# Patient Record
Sex: Female | Born: 1995 | Race: White | Hispanic: No | Marital: Single | Smoking: Never smoker
Health system: Southern US, Community
[De-identification: ages and names within clinical notes are randomized; demographics above are authoritative.]

## PROBLEM LIST (undated history)

## (undated) VITALS — BP 152/104 | HR 115 | Temp 98.0°F | Resp 18 | Ht 65.0 in | Wt 199.0 lb

## (undated) DIAGNOSIS — I10 Essential (primary) hypertension: Secondary | ICD-10-CM

## (undated) DIAGNOSIS — F32A Depression, unspecified: Secondary | ICD-10-CM

## (undated) DIAGNOSIS — F4310 Post-traumatic stress disorder, unspecified: Secondary | ICD-10-CM

## (undated) DIAGNOSIS — K824 Cholesterolosis of gallbladder: Secondary | ICD-10-CM

## (undated) DIAGNOSIS — IMO0002 Reserved for concepts with insufficient information to code with codable children: Secondary | ICD-10-CM

## (undated) DIAGNOSIS — K449 Diaphragmatic hernia without obstruction or gangrene: Secondary | ICD-10-CM

## (undated) DIAGNOSIS — F419 Anxiety disorder, unspecified: Secondary | ICD-10-CM

## (undated) DIAGNOSIS — F1290 Cannabis use, unspecified, uncomplicated: Secondary | ICD-10-CM

## (undated) DIAGNOSIS — R112 Nausea with vomiting, unspecified: Secondary | ICD-10-CM

## (undated) DIAGNOSIS — F12988 Cannabis use, unspecified with other cannabis-induced disorder: Secondary | ICD-10-CM

## (undated) DIAGNOSIS — F1310 Sedative, hypnotic or anxiolytic abuse, uncomplicated: Secondary | ICD-10-CM

## (undated) DIAGNOSIS — F329 Major depressive disorder, single episode, unspecified: Secondary | ICD-10-CM

---

## 2015-04-25 ENCOUNTER — Encounter (HOSPITAL_COMMUNITY): Payer: Self-pay | Admitting: Emergency Medicine

## 2015-04-25 ENCOUNTER — Emergency Department (HOSPITAL_COMMUNITY)
Admission: EM | Admit: 2015-04-25 | Discharge: 2015-04-26 | Disposition: A | Discharge status: Home / Self Care | Payer: BLUE CROSS/BLUE SHIELD | Attending: Emergency Medicine | Admitting: Emergency Medicine

## 2015-04-25 ENCOUNTER — Emergency Department (HOSPITAL_COMMUNITY): Payer: BLUE CROSS/BLUE SHIELD

## 2015-04-25 DIAGNOSIS — R109 Unspecified abdominal pain: Secondary | ICD-10-CM | POA: Diagnosis present

## 2015-04-25 DIAGNOSIS — R112 Nausea with vomiting, unspecified: Secondary | ICD-10-CM | POA: Diagnosis not present

## 2015-04-25 DIAGNOSIS — Z3202 Encounter for pregnancy test, result negative: Secondary | ICD-10-CM | POA: Diagnosis not present

## 2015-04-25 LAB — CBC
HCT: 41 % (ref 36.0–46.0)
HEMOGLOBIN: 14.2 g/dL (ref 12.0–15.0)
MCH: 29.3 pg (ref 26.0–34.0)
MCHC: 34.6 g/dL (ref 30.0–36.0)
MCV: 84.7 fL (ref 78.0–100.0)
Platelets: 344 10*3/uL (ref 150–400)
RBC: 4.84 MIL/uL (ref 3.87–5.11)
RDW: 12.8 % (ref 11.5–15.5)
WBC: 19 10*3/uL — ABNORMAL HIGH (ref 4.0–10.5)

## 2015-04-25 LAB — COMPREHENSIVE METABOLIC PANEL
ALBUMIN: 5.1 g/dL — AB (ref 3.5–5.0)
ALT: 16 U/L (ref 14–54)
ANION GAP: 12 (ref 5–15)
AST: 19 U/L (ref 15–41)
Alkaline Phosphatase: 72 U/L (ref 38–126)
BILIRUBIN TOTAL: 0.5 mg/dL (ref 0.3–1.2)
BUN: 9 mg/dL (ref 6–20)
CO2: 23 mmol/L (ref 22–32)
CREATININE: 0.62 mg/dL (ref 0.44–1.00)
Calcium: 9.7 mg/dL (ref 8.9–10.3)
Chloride: 101 mmol/L (ref 101–111)
GFR calc Af Amer: >60 mL/min (ref >60)
GFR calc non Af Amer: >60 mL/min (ref >60)
Glucose, Bld: 131 mg/dL — ABNORMAL HIGH (ref 65–99)
Potassium: 3.7 mmol/L (ref 3.5–5.1)
SODIUM: 136 mmol/L (ref 135–145)
TOTAL PROTEIN: 8.5 g/dL — AB (ref 6.5–8.1)

## 2015-04-25 LAB — CBC WITH DIFFERENTIAL/PLATELET
Basophils Absolute: 0 10*3/uL (ref 0.0–0.1)
Basophils Relative: 0 % (ref 0–1)
Eosinophils Absolute: 0.1 10*3/uL (ref 0.0–0.7)
Eosinophils Relative: 1 % (ref 0–5)
HCT: 39.8 % (ref 36.0–46.0)
HEMOGLOBIN: 12.5 g/dL (ref 12.0–15.0)
LYMPHS ABS: 1.8 10*3/uL (ref 0.7–4.0)
Lymphocytes Relative: 23 % (ref 12–46)
MCH: 25.7 pg — ABNORMAL LOW (ref 26.0–34.0)
MCHC: 31.4 g/dL (ref 30.0–36.0)
MCV: 81.7 fL (ref 78.0–100.0)
MONOS PCT: 6 % (ref 3–12)
Monocytes Absolute: 0.5 10*3/uL (ref 0.1–1.0)
NEUTROS ABS: 5.6 10*3/uL (ref 1.7–7.7)
NEUTROS PCT: 70 % (ref 43–77)
PLATELETS: 227 10*3/uL (ref 150–400)
RBC: 4.87 MIL/uL (ref 3.87–5.11)
RDW: 15.6 % — ABNORMAL HIGH (ref 11.5–15.5)
WBC: 8 10*3/uL (ref 4.0–10.5)

## 2015-04-25 LAB — DIFFERENTIAL
BASOS PCT: 0 % (ref 0–1)
Basophils Absolute: 0 10*3/uL (ref 0.0–0.1)
EOS ABS: 0 10*3/uL (ref 0.0–0.7)
Eosinophils Relative: 0 % (ref 0–5)
Lymphocytes Relative: 6 % — ABNORMAL LOW (ref 12–46)
Lymphs Abs: 1.1 10*3/uL (ref 0.7–4.0)
MONOS PCT: 2 % — AB (ref 3–12)
Monocytes Absolute: 0.3 10*3/uL (ref 0.1–1.0)
Neutro Abs: 17.6 10*3/uL — ABNORMAL HIGH (ref 1.7–7.7)
Neutrophils Relative %: 92 % — ABNORMAL HIGH (ref 43–77)

## 2015-04-25 LAB — POC URINE PREG, ED: Preg Test, Ur: NEGATIVE

## 2015-04-25 MED ORDER — IOHEXOL 300 MG/ML  SOLN
100.0000 mL | Freq: Once | INTRAMUSCULAR | Status: AC | PRN
  Administered 2015-04-25: 100 mL via INTRAVENOUS

## 2015-04-25 MED ORDER — IOHEXOL 300 MG/ML  SOLN
25.0000 mL | Freq: Once | INTRAMUSCULAR | Status: AC | PRN
  Administered 2015-04-25: 50 mL via ORAL

## 2015-04-25 MED ORDER — HYDROMORPHONE HCL 1 MG/ML IJ SOLN
0.5000 mg | Freq: Once | INTRAMUSCULAR | Status: AC
  Administered 2015-04-25: 0.5 mg via INTRAVENOUS
  Filled 2015-04-25: qty 1

## 2015-04-25 MED ORDER — ONDANSETRON HCL 4 MG/2ML IJ SOLN
4.0000 mg | Freq: Once | INTRAMUSCULAR | Status: AC
  Administered 2015-04-25: 4 mg via INTRAVENOUS
  Filled 2015-04-25: qty 2

## 2015-04-25 MED ORDER — SODIUM CHLORIDE 0.9 % IV BOLUS (SEPSIS)
1000.0000 mL | Freq: Once | INTRAVENOUS | Status: AC
Start: 2015-04-25 — End: 2015-04-26
  Administered 2015-04-25: 1000 mL via INTRAVENOUS

## 2015-04-25 NOTE — ED Notes (Signed)
Reports received from previous RN, [employer]

## 2015-04-25 NOTE — ED Notes (Signed)
After lab draw blood walked down to main lab

## 2015-04-25 NOTE — ED Provider Notes (Signed)
CSN: [id number]     Arrival date & time 04/25/15  1740 History   First MD Initiated Contact with Patient 04/25/15 2146     Chief Complaint  Patient presents with  . Abdominal Pain     (Consider location/radiation/quality/duration/timing/severity/associated sxs/prior Treatment) Patient is a 19 y.o. female presenting with vomiting. The history is provided by the patient. No language interpreter was used.  Emesis Severity:  Moderate Duration:  1 day Timing:  Constant Number of daily episodes:  Multiple Quality:  Unable to specify Progression:  Unchanged Chronicity:  New Recent urination:  Normal Relieved by:  Nothing Worsened by:  Nothing tried Ineffective treatments:  None tried Associated symptoms: abdominal pain   Risk factors: sick contacts   Pt complains of nausea and vomiting.  Pt reports cramping in abdomen.  Pt has soreness right side of abdomen and cramping on right side.  History reviewed. No pertinent past medical history. History reviewed. No pertinent past surgical history. No family history on file. History  Substance Use Topics  . Smoking status: Never Smoker   . Smokeless tobacco: Not on file  . Alcohol Use: No   OB History    No data available     Review of Systems  Gastrointestinal: Positive for vomiting and abdominal pain.  All other systems reviewed and are negative.     Allergies  Review of patient's allergies indicates not on file.  Home Medications   Prior to Admission medications   Not on File   BP 152/79 mmHg  Pulse 85  Temp(Src) 98.3 F (36.8 C) (Oral)  Resp 16  SpO2 100%  LMP 04/11/2015 Physical Exam  Constitutional: She is oriented to person, place, and time. She appears well-developed and well-nourished.  HENT:  Head: Normocephalic and atraumatic.  Right Ear: External ear normal.  Left Ear: External ear normal.  Eyes: Conjunctivae and EOM are normal. Pupils are equal, round, and reactive to light.  Neck: Normal range of  motion.  Cardiovascular: Normal rate and normal heart sounds.   Pulmonary/Chest: Effort normal.  Abdominal: Soft. She exhibits no distension.  Musculoskeletal: Normal range of motion.  Neurological: She is alert and oriented to person, place, and time.  Skin: Skin is warm.  Psychiatric: She has a normal mood and affect.  Nursing note and vitals reviewed.   ED Course  Procedures (including critical care time) Labs Review Labs Reviewed  CBC WITH DIFFERENTIAL/PLATELET - Abnormal; Notable for the following:    MCH 25.7 (*)    RDW 15.6 (*)    All other components within normal limits  CBC - Abnormal; Notable for the following:    WBC 19.0 (*)    All other components within normal limits  COMPREHENSIVE METABOLIC PANEL - Abnormal; Notable for the following:    Glucose, Bld 131 (*)    Total Protein 8.5 (*)    Albumin 5.1 (*)    All other components within normal limits  DIFFERENTIAL - Abnormal; Notable for the following:    Neutrophils Relative % 92 (*)    Neutro Abs 17.6 (*)    Lymphocytes Relative 6 (*)    Monocytes Relative 2 (*)    All other components within normal limits  URINALYSIS, ROUTINE W REFLEX MICROSCOPIC (NOT AT [facility])  POC URINE PREG, ED   Results for orders placed or performed during the hospital encounter of 04/25/15  CBC with Differential  Result Value Ref Range   WBC 8.0 4.0 - 10.5 K/uL   RBC 4.87 3.87 -  5.11 MIL/uL   Hemoglobin 12.5 12.0 - 15.0 g/dL   HCT 78.2 95.6 - 21.3 %   MCV 81.7 78.0 - 100.0 fL   MCH 25.7 (L) 26.0 - 34.0 pg   MCHC 31.4 30.0 - 36.0 g/dL   RDW 08.6 (H) 57.8 - 46.9 %   Platelets 227 150 - 400 K/uL   Neutrophils Relative % 70 43 - 77 %   Neutro Abs 5.6 1.7 - 7.7 K/uL   Lymphocytes Relative 23 12 - 46 %   Lymphs Abs 1.8 0.7 - 4.0 K/uL   Monocytes Relative 6 3 - 12 %   Monocytes Absolute 0.5 0.1 - 1.0 K/uL   Eosinophils Relative 1 0 - 5 %   Eosinophils Absolute 0.1 0.0 - 0.7 K/uL   Basophils Relative 0 0 - 1 %   Basophils Absolute  0.0 0.0 - 0.1 K/uL  Urinalysis, Routine w reflex microscopic (not at [facility])  Result Value Ref Range   Color, Urine YELLOW YELLOW   APPearance CLEAR CLEAR   Specific Gravity, Urine 1.023 1.005 - 1.030   pH 7.5 5.0 - 8.0   Glucose, UA NEGATIVE NEGATIVE mg/dL   Hgb urine dipstick TRACE (A) NEGATIVE   Bilirubin Urine NEGATIVE NEGATIVE   Ketones, ur >80 (A) NEGATIVE mg/dL   Protein, ur NEGATIVE NEGATIVE mg/dL   Urobilinogen, UA 0.2 0.0 - 1.0 mg/dL   Nitrite NEGATIVE NEGATIVE   Leukocytes, UA NEGATIVE NEGATIVE  CBC  Result Value Ref Range   WBC 19.0 (H) 4.0 - 10.5 K/uL   RBC 4.84 3.87 - 5.11 MIL/uL   Hemoglobin 14.2 12.0 - 15.0 g/dL   HCT 62.9 52.8 - 41.3 %   MCV 84.7 78.0 - 100.0 fL   MCH 29.3 26.0 - 34.0 pg   MCHC 34.6 30.0 - 36.0 g/dL   RDW 24.4 01.0 - 27.2 %   Platelets 344 150 - 400 K/uL  Comprehensive metabolic panel  Result Value Ref Range   Sodium 136 135 - 145 mmol/L   Potassium 3.7 3.5 - 5.1 mmol/L   Chloride 101 101 - 111 mmol/L   CO2 23 22 - 32 mmol/L   Glucose, Bld 131 (H) 65 - 99 mg/dL   BUN 9 6 - 20 mg/dL   Creatinine, Ser 5.36 0.44 - 1.00 mg/dL   Calcium 9.7 8.9 - 64.4 mg/dL   Total Protein 8.5 (H) 6.5 - 8.1 g/dL   Albumin 5.1 (H) 3.5 - 5.0 g/dL   AST 19 15 - 41 U/L   ALT 16 14 - 54 U/L   Alkaline Phosphatase 72 38 - 126 U/L   Total Bilirubin 0.5 0.3 - 1.2 mg/dL   GFR calc non Af Amer >60 >60 mL/min   GFR calc Af Amer >60 >60 mL/min   Anion gap 12 5 - 15  Differential  Result Value Ref Range   Neutrophils Relative % 92 (H) 43 - 77 %   Neutro Abs 17.6 (H) 1.7 - 7.7 K/uL   Lymphocytes Relative 6 (L) 12 - 46 %   Lymphs Abs 1.1 0.7 - 4.0 K/uL   Monocytes Relative 2 (L) 3 - 12 %   Monocytes Absolute 0.3 0.1 - 1.0 K/uL   Eosinophils Relative 0 0 - 5 %   Eosinophils Absolute 0.0 0.0 - 0.7 K/uL   Basophils Relative 0 0 - 1 %   Basophils Absolute 0.0 0.0 - 0.1 K/uL  Urine microscopic-add on  Result Value Ref Range   Squamous Epithelial /  LPF RARE RARE    WBC, UA 0-2 <3 WBC/hpf   RBC / HPF 0-2 <3 RBC/hpf   Bacteria, UA RARE RARE  POC Urine Pregnancy, ED  (If Pre-menopausal female)  not at [facility]  Result Value Ref Range   Preg Test, Ur NEGATIVE NEGATIVE   Ct Abdomen Pelvis W Contrast  04/26/2015   CLINICAL DATA:  Pain with vomiting beginning this morning. Leukocytosis.  EXAM: CT ABDOMEN AND PELVIS WITH CONTRAST  TECHNIQUE: Multidetector CT imaging of the abdomen and pelvis was performed using the standard protocol following bolus administration of intravenous contrast.  CONTRAST:  50mL OMNIPAQUE IOHEXOL 300 MG/ML SOLN, OMNIPAQUE IOHEXOL 300 MG/ML SOLN  COMPARISON:  None.  FINDINGS: LUNG BASES: Included view of the lung bases are clear. Visualized heart and pericardium are unremarkable.  SOLID ORGANS: The liver, spleen, gallbladder, pancreas and adrenal glands are unremarkable.  GASTROINTESTINAL TRACT: The stomach, small and large bowel are normal in course and caliber without inflammatory changes. Identifiable portions of the appendix appear normal.  KIDNEYS/ URINARY TRACT: Kidneys are orthotopic, demonstrating symmetric enhancement. No nephrolithiasis, hydronephrosis or solid renal masses. The unopacified ureters are normal in course and caliber. Urinary bladder is partially distended and unremarkable.  PERITONEUM/RETROPERITONEUM: Aortoiliac vessels are normal in course and caliber. No lymphadenopathy by CT size criteria. Thickened edematous appearing cervix. Small area presumed blood products within the lower uterine segment. Small amount of free fluid in the pelvis without abscess nor intraperitoneal free air.  SOFT TISSUE/OSSEOUS STRUCTURES: Non-suspicious. Subcentimeter probable bone island RIGHT sacrum.  IMPRESSION: Findings concerning for cervicitis.  No CT evidence of acute appendicitis.   Electronically Signed   By: Awilda Metro M.D.   On: 04/26/2015 00:28    Imaging Review No results found.   EKG Interpretation None      MDM   Pt  given iv fluids, zofran and dilaudid.  Ct scan no appendicitis.   Final diagnoses:  Abdominal pain  Non-intractable vomiting with nausea, vomiting of unspecified type    zofran odt Follow up with primary care.  Return if symptoms persist more than 24 hours.    Elson Areas, PA-C 04/26/15 0114  Elson Areas, PA-C 04/26/15 0140  Donnetta Hutching, MD 04/26/15 [phone]

## 2015-04-25 NOTE — ED Notes (Addendum)
Notified main lab that blood was mislabeled.  Also notified charge nurse. Noitified EDP Adriana Simas and [place]

## 2015-04-25 NOTE — ED Notes (Signed)
Per pt, states abdominal pain since this am-vomiting

## 2015-04-25 NOTE — ED Notes (Signed)
Informed the pt that a urine sample is needed.

## 2015-04-26 LAB — URINALYSIS, ROUTINE W REFLEX MICROSCOPIC
Bilirubin Urine: NEGATIVE
Glucose, UA: NEGATIVE mg/dL
Ketones, ur: >80 mg/dL — AB
Leukocytes, UA: NEGATIVE
Nitrite: NEGATIVE
Protein, ur: NEGATIVE mg/dL
Specific Gravity, Urine: 1.023 (ref 1.005–1.030)
Urobilinogen, UA: 0.2 mg/dL (ref 0.0–1.0)
pH: 7.5 (ref 5.0–8.0)

## 2015-04-26 LAB — URINE MICROSCOPIC-ADD ON

## 2015-04-26 MED ORDER — ONDANSETRON 4 MG PO TBDP: 4.0000 mg | ORAL_TABLET | Freq: Three times a day (TID) | ORAL | Status: DC | PRN

## 2015-04-26 MED ORDER — SODIUM CHLORIDE 0.9 % IV SOLN
Freq: Once | INTRAVENOUS | Status: AC
  Administered 2015-04-26: 01:00:00 via INTRAVENOUS

## 2015-04-26 NOTE — Discharge Instructions (Signed)
Abdominal Pain °Many things can cause abdominal pain. Usually, abdominal pain is not caused by a disease and will improve without treatment. It can often be observed and treated at home. Your health care provider will do a physical exam and possibly order blood tests and X-rays to help determine the seriousness of your pain. However, in many cases, more time must pass before a clear cause of the pain can be found. Before that point, your health care provider may not know if you need more testing or further treatment. °HOME CARE INSTRUCTIONS  °Monitor your abdominal pain for any changes. The following actions may help to alleviate any discomfort you are experiencing: °· Only take over-the-counter or prescription medicines as directed by your health care provider. °· Do not take laxatives unless directed to do so by your health care provider. °· Try a clear liquid diet (broth, tea, or water) as directed by your health care provider. Slowly move to a bland diet as tolerated. °SEEK MEDICAL CARE IF: °· You have unexplained abdominal pain. °· You have abdominal pain associated with nausea or diarrhea. °· You have pain when you urinate or have a bowel movement. °· You experience abdominal pain that wakes you in the night. °· You have abdominal pain that is worsened or improved by eating food. °· You have abdominal pain that is worsened with eating fatty foods. °· You have a fever. °SEEK IMMEDIATE MEDICAL CARE IF:  °· Your pain does not go away within 2 hours. °· You keep throwing up (vomiting). °· Your pain is felt only in portions of the abdomen, such as the right side or the left lower portion of the abdomen. °· You pass bloody or black tarry stools. °MAKE SURE YOU: °· Understand these instructions.   °· Will watch your condition.   °· Will get help right away if you are not doing well or get worse.   °Document Released: 07/28/2005 Document Revised: 10/23/2013 Document Reviewed: 06/27/2013 °[employer]® Patient Information  ©2015 [employer]. This information is not intended to replace advice given to you by your health care provider. Make sure you discuss any questions you have with your health care provider. ° °Nausea and Vomiting °Nausea is a sick feeling that often comes before throwing up (vomiting). Vomiting is a reflex where stomach contents come out of your mouth. Vomiting can cause severe loss of body fluids (dehydration). Children and elderly adults can become dehydrated quickly, especially if they also have diarrhea. Nausea and vomiting are symptoms of a condition or disease. It is important to find the cause of your symptoms. °CAUSES  °· Direct irritation of the stomach lining. This irritation can result from increased acid production (gastroesophageal reflux disease), infection, food poisoning, taking certain medicines (such as nonsteroidal anti-inflammatory drugs), alcohol use, or tobacco use. °· Signals from the brain. These signals could be caused by a headache, heat exposure, an inner ear disturbance, increased pressure in the brain from injury, infection, a tumor, or a concussion, pain, emotional stimulus, or metabolic problems. °· An obstruction in the gastrointestinal tract (bowel obstruction). °· Illnesses such as diabetes, hepatitis, gallbladder problems, appendicitis, kidney problems, cancer, sepsis, atypical symptoms of a heart attack, or eating disorders. °· Medical treatments such as chemotherapy and radiation. °· Receiving medicine that makes you sleep (general anesthetic) during surgery. °DIAGNOSIS °Your caregiver may ask for tests to be done if the problems do not improve after a few days. Tests may also be done if symptoms are severe or if the reason for the nausea   and vomiting is not clear. Tests may include: °· Urine tests. °· Blood tests. °· Stool tests. °· Cultures (to look for evidence of infection). °· X-rays or other imaging studies. °Test results can help your caregiver make decisions about  treatment or the need for additional tests. °TREATMENT °You need to stay well hydrated. Drink frequently but in small amounts. You may wish to drink water, sports drinks, clear broth, or eat frozen ice pops or gelatin dessert to help stay hydrated. When you eat, eating slowly may help prevent nausea. There are also some antinausea medicines that may help prevent nausea. °HOME CARE INSTRUCTIONS  °· Take all medicine as directed by your caregiver. °· If you do not have an appetite, do not force yourself to eat. However, you must continue to drink fluids. °· If you have an appetite, eat a normal diet unless your caregiver tells you differently. °¨ Eat a variety of complex carbohydrates (rice, wheat, potatoes, bread), lean meats, yogurt, fruits, and vegetables. °¨ Avoid high-fat foods because they are more difficult to digest. °· Drink enough water and fluids to keep your urine clear or pale yellow. °· If you are dehydrated, ask your caregiver for specific rehydration instructions. Signs of dehydration may include: °¨ Severe thirst. °¨ Dry lips and mouth. °¨ Dizziness. °¨ Dark urine. °¨ Decreasing urine frequency and amount. °¨ Confusion. °¨ Rapid breathing or pulse. °SEEK IMMEDIATE MEDICAL CARE IF:  °· You have blood or brown flecks (like coffee grounds) in your vomit. °· You have black or bloody stools. °· You have a severe headache or stiff neck. °· You are confused. °· You have severe abdominal pain. °· You have chest pain or trouble breathing. °· You do not urinate at least once every 8 hours. °· You develop cold or clammy skin. °· You continue to vomit for longer than 24 to 48 hours. °· You have a fever. °MAKE SURE YOU:  °· Understand these instructions. °· Will watch your condition. °· Will get help right away if you are not doing well or get worse. °Document Released: 10/18/2005 Document Revised: 01/10/2012 Document Reviewed: 03/17/2011 °[employer]® Patient Information ©2015 [employer]. This information is not  intended to replace advice given to you by your health care provider. Make sure you discuss any questions you have with your health care provider. ° °

## 2015-04-29 ENCOUNTER — Emergency Department (HOSPITAL_COMMUNITY)
Admission: EM | Admit: 2015-04-29 | Discharge: 2015-04-29 | Disposition: A | Discharge status: Home / Self Care | Payer: BLUE CROSS/BLUE SHIELD | Attending: Emergency Medicine | Admitting: Emergency Medicine

## 2015-04-29 ENCOUNTER — Encounter (HOSPITAL_COMMUNITY): Payer: Self-pay

## 2015-04-29 ENCOUNTER — Emergency Department (HOSPITAL_COMMUNITY): Payer: BLUE CROSS/BLUE SHIELD

## 2015-04-29 DIAGNOSIS — R064 Hyperventilation: Secondary | ICD-10-CM | POA: Insufficient documentation

## 2015-04-29 DIAGNOSIS — R1013 Epigastric pain: Secondary | ICD-10-CM | POA: Diagnosis present

## 2015-04-29 DIAGNOSIS — Z3202 Encounter for pregnancy test, result negative: Secondary | ICD-10-CM | POA: Diagnosis not present

## 2015-04-29 DIAGNOSIS — K2970 Gastritis, unspecified, without bleeding: Secondary | ICD-10-CM

## 2015-04-29 LAB — URINALYSIS, ROUTINE W REFLEX MICROSCOPIC
BILIRUBIN URINE: NEGATIVE
Glucose, UA: NEGATIVE mg/dL
Ketones, ur: >80 mg/dL — AB
Leukocytes, UA: NEGATIVE
Nitrite: NEGATIVE
PH: 8 (ref 5.0–8.0)
Protein, ur: NEGATIVE mg/dL
SPECIFIC GRAVITY, URINE: 1.02 (ref 1.005–1.030)
Urobilinogen, UA: 0.2 mg/dL (ref 0.0–1.0)

## 2015-04-29 LAB — CBC WITH DIFFERENTIAL/PLATELET
Basophils Absolute: 0 10*3/uL (ref 0.0–0.1)
Basophils Relative: 0 % (ref 0–1)
EOS ABS: 0 10*3/uL (ref 0.0–0.7)
EOS PCT: 0 % (ref 0–5)
HCT: 44.6 % (ref 36.0–46.0)
HEMOGLOBIN: 15.5 g/dL — AB (ref 12.0–15.0)
LYMPHS ABS: 1.1 10*3/uL (ref 0.7–4.0)
Lymphocytes Relative: 8 % — ABNORMAL LOW (ref 12–46)
MCH: 29.5 pg (ref 26.0–34.0)
MCHC: 34.8 g/dL (ref 30.0–36.0)
MCV: 84.8 fL (ref 78.0–100.0)
MONOS PCT: 3 % (ref 3–12)
Monocytes Absolute: 0.4 10*3/uL (ref 0.1–1.0)
Neutro Abs: 12.3 10*3/uL — ABNORMAL HIGH (ref 1.7–7.7)
Neutrophils Relative %: 89 % — ABNORMAL HIGH (ref 43–77)
Platelets: 411 10*3/uL — ABNORMAL HIGH (ref 150–400)
RBC: 5.26 MIL/uL — AB (ref 3.87–5.11)
RDW: 12.7 % (ref 11.5–15.5)
WBC: 13.8 10*3/uL — ABNORMAL HIGH (ref 4.0–10.5)

## 2015-04-29 LAB — COMPREHENSIVE METABOLIC PANEL
ALBUMIN: 5.2 g/dL — AB (ref 3.5–5.0)
ALK PHOS: 66 U/L (ref 38–126)
ALT: 18 U/L (ref 14–54)
AST: 19 U/L (ref 15–41)
Anion gap: 16 — ABNORMAL HIGH (ref 5–15)
BILIRUBIN TOTAL: 0.8 mg/dL (ref 0.3–1.2)
BUN: 8 mg/dL (ref 6–20)
CHLORIDE: 102 mmol/L (ref 101–111)
CO2: 22 mmol/L (ref 22–32)
CREATININE: 0.88 mg/dL (ref 0.44–1.00)
Calcium: 9.8 mg/dL (ref 8.9–10.3)
GFR calc Af Amer: >60 mL/min (ref >60)
GFR calc non Af Amer: >60 mL/min (ref >60)
Glucose, Bld: 135 mg/dL — ABNORMAL HIGH (ref 65–99)
Potassium: 3.3 mmol/L — ABNORMAL LOW (ref 3.5–5.1)
Sodium: 140 mmol/L (ref 135–145)
Total Protein: 8.3 g/dL — ABNORMAL HIGH (ref 6.5–8.1)

## 2015-04-29 LAB — WET PREP, GENITAL
TRICH WET PREP: NONE SEEN
Yeast Wet Prep HPF POC: NONE SEEN

## 2015-04-29 LAB — URINE MICROSCOPIC-ADD ON

## 2015-04-29 LAB — PREGNANCY, URINE: Preg Test, Ur: NEGATIVE

## 2015-04-29 LAB — LIPASE, BLOOD: Lipase: 23 U/L (ref 22–51)

## 2015-04-29 LAB — POC URINE PREG, ED: PREG TEST UR: NEGATIVE

## 2015-04-29 MED ORDER — PANTOPRAZOLE SODIUM 40 MG IV SOLR
40.0000 mg | Freq: Once | INTRAVENOUS | Status: AC
  Administered 2015-04-29: 40 mg via INTRAVENOUS
  Filled 2015-04-29: qty 40

## 2015-04-29 MED ORDER — SODIUM CHLORIDE 0.9 % IV BOLUS (SEPSIS)
1000.0000 mL | Freq: Once | INTRAVENOUS | Status: AC
  Administered 2015-04-29: 1000 mL via INTRAVENOUS

## 2015-04-29 MED ORDER — ONDANSETRON 8 MG PO TBDP
8.0000 mg | ORAL_TABLET | Freq: Once | ORAL | Status: AC
  Administered 2015-04-29: 8 mg via ORAL
  Filled 2015-04-29: qty 1

## 2015-04-29 MED ORDER — ONDANSETRON HCL 4 MG/2ML IJ SOLN
4.0000 mg | Freq: Once | INTRAMUSCULAR | Status: AC
Start: 2015-04-29 — End: 2015-04-29
  Administered 2015-04-29: 4 mg via INTRAVENOUS
  Filled 2015-04-29: qty 2

## 2015-04-29 MED ORDER — HYDROMORPHONE HCL 1 MG/ML IJ SOLN
0.5000 mg | Freq: Once | INTRAMUSCULAR | Status: AC
  Administered 2015-04-29: 0.5 mg via INTRAVENOUS
  Filled 2015-04-29: qty 1

## 2015-04-29 MED ORDER — TRAMADOL HCL 50 MG PO TABS: 100.0000 mg | ORAL_TABLET | Freq: Four times a day (QID) | ORAL | Status: DC | PRN

## 2015-04-29 MED ORDER — OMEPRAZOLE 20 MG PO CPDR: 20.0000 mg | DELAYED_RELEASE_CAPSULE | Freq: Every day | ORAL | Status: DC

## 2015-04-29 MED ORDER — PROMETHAZINE HCL 25 MG PO TABS: 25.0000 mg | ORAL_TABLET | Freq: Four times a day (QID) | ORAL | Status: DC | PRN

## 2015-04-29 NOTE — Discharge Instructions (Signed)
Abdominal Pain, Women °Abdominal (stomach, pelvic, or belly) pain can be caused by many things. It is important to tell your doctor: °· The location of the pain. °· Does it come and go or is it present all the time? °· Are there things that start the pain (eating certain foods, exercise)? °· Are there other symptoms associated with the pain (fever, nausea, vomiting, diarrhea)? °All of this is helpful to know when trying to find the cause of the pain. °CAUSES  °· Stomach: virus or bacteria infection, or ulcer. °· Intestine: appendicitis (inflamed appendix), regional ileitis (Crohn's disease), ulcerative colitis (inflamed colon), irritable bowel syndrome, diverticulitis (inflamed diverticulum of the colon), or cancer of the stomach or intestine. °· Gallbladder disease or stones in the gallbladder. °· Kidney disease, kidney stones, or infection. °· Pancreas infection or cancer. °· Fibromyalgia (pain disorder). °· Diseases of the female organs: °· Uterus: fibroid (non-cancerous) tumors or infection. °· Fallopian tubes: infection or tubal pregnancy. °· Ovary: cysts or tumors. °· Pelvic adhesions (scar tissue). °· Endometriosis (uterus lining tissue growing in the pelvis and on the pelvic organs). °· Pelvic congestion syndrome (female organs filling up with blood just before the menstrual period). °· Pain with the menstrual period. °· Pain with ovulation (producing an egg). °· Pain with an IUD (intrauterine device, birth control) in the uterus. °· Cancer of the female organs. °· Functional pain (pain not caused by a disease, may improve without treatment). °· Psychological pain. °· Depression. °DIAGNOSIS  °Your doctor will decide the seriousness of your pain by doing an examination. °· Blood tests. °· X-rays. °· Ultrasound. °· CT scan (computed tomography, special type of X-ray). °· MRI (magnetic resonance imaging). °· Cultures, for infection. °· Barium enema (dye inserted in the large intestine, to better view it with  X-rays). °· Colonoscopy (looking in intestine with a lighted tube). °· Laparoscopy (minor surgery, looking in abdomen with a lighted tube). °· Major abdominal exploratory surgery (looking in abdomen with a large incision). °TREATMENT  °The treatment will depend on the cause of the pain.  °· Many cases can be observed and treated at home. °· Over-the-counter medicines recommended by your caregiver. °· Prescription medicine. °· Antibiotics, for infection. °· Birth control pills, for painful periods or for ovulation pain. °· Hormone treatment, for endometriosis. °· Nerve blocking injections. °· Physical therapy. °· Antidepressants. °· Counseling with a psychologist or psychiatrist. °· Minor or major surgery. °HOME CARE INSTRUCTIONS  °· Do not take laxatives, unless directed by your caregiver. °· Take over-the-counter pain medicine only if ordered by your caregiver. Do not take aspirin because it can cause an upset stomach or bleeding. °· Try a clear liquid diet (broth or water) as ordered by your caregiver. Slowly move to a bland diet, as tolerated, if the pain is related to the stomach or intestine. °· Have a thermometer and take your temperature several times a day, and record it. °· Bed rest and sleep, if it helps the pain. °· Avoid sexual intercourse, if it causes pain. °· Avoid stressful situations. °· Keep your follow-up appointments and tests, as your caregiver orders. °· If the pain does not go away with medicine or surgery, you may try: °· Acupuncture. °· Relaxation exercises (yoga, meditation). °· Group therapy. °· Counseling. °SEEK MEDICAL CARE IF:  °· You notice certain foods cause stomach pain. °· Your home care treatment is not helping your pain. °· You need stronger pain medicine. °· You want your IUD removed. °· You feel faint or   lightheaded. °· You develop nausea and vomiting. °· You develop a rash. °· You are having side effects or an allergy to your medicine. °SEEK IMMEDIATE MEDICAL CARE IF:  °· Your  pain does not go away or gets worse. °· You have a fever. °· Your pain is felt only in portions of the abdomen. The right side could possibly be appendicitis. The left lower portion of the abdomen could be colitis or diverticulitis. °· You are passing blood in your stools (bright red or black tarry stools, with or without vomiting). °· You have blood in your urine. °· You develop chills, with or without a fever. °· You pass out. °MAKE SURE YOU:  °· Understand these instructions. °· Will watch your condition. °· Will get help right away if you are not doing well or get worse. °Document Released: 08/15/2007 Document Revised: 03/04/2014 Document Reviewed: 09/04/2009 °ExitCare® Patient Information ©2015 [employer]. This information is not intended to replace advice given to you by your health care provider. Make sure you discuss any questions you have with your health care provider. ° °Gastritis, Adult °Gastritis is soreness and swelling (inflammation) of the lining of the stomach. Gastritis can develop as a sudden onset (acute) or long-term (chronic) condition. If gastritis is not treated, it can lead to stomach bleeding and ulcers. °CAUSES  °Gastritis occurs when the stomach lining is weak or damaged. Digestive juices from the stomach then inflame the weakened stomach lining. The stomach lining may be weak or damaged due to viral or bacterial infections. One common bacterial infection is the Helicobacter pylori infection. Gastritis can also result from excessive alcohol consumption, taking certain medicines, or having too much acid in the stomach.  °SYMPTOMS  °In some cases, there are no symptoms. When symptoms are present, they may include: °· Pain or a burning sensation in the upper abdomen. °· Nausea. °· Vomiting. °· An uncomfortable feeling of fullness after eating. °DIAGNOSIS  °Your caregiver may suspect you have gastritis based on your symptoms and a physical exam. To determine the cause of your gastritis,  your caregiver may perform the following: °· Blood or stool tests to check for the H pylori bacterium. °· Gastroscopy. A thin, flexible tube (endoscope) is passed down the esophagus and into the stomach. The endoscope has a light and camera on the end. Your caregiver uses the endoscope to view the inside of the stomach. °· Taking a tissue sample (biopsy) from the stomach to examine under a microscope. °TREATMENT  °Depending on the cause of your gastritis, medicines may be prescribed. If you have a bacterial infection, such as an H pylori infection, antibiotics may be given. If your gastritis is caused by too much acid in the stomach, H2 blockers or antacids may be given. Your caregiver may recommend that you stop taking aspirin, ibuprofen, or other nonsteroidal anti-inflammatory drugs (NSAIDs). °HOME CARE INSTRUCTIONS °· Only take over-the-counter or prescription medicines as directed by your caregiver. °· If you were given antibiotic medicines, take them as directed. Finish them even if you start to feel better. °· Drink enough fluids to keep your urine clear or pale yellow. °· Avoid foods and drinks that make your symptoms worse, such as: °¨ Caffeine or alcoholic drinks. °¨ Chocolate. °¨ Peppermint or mint flavorings. °¨ Garlic and onions. °¨ Spicy foods. °¨ Citrus fruits, such as oranges, lemons, or limes. °¨ Tomato-based foods such as sauce, chili, salsa, and pizza. °¨ Fried and fatty foods. °· Eat small, frequent meals instead of large meals. °  SEEK IMMEDIATE MEDICAL CARE IF:  °· You have black or dark red stools. °· You vomit blood or material that looks like coffee grounds. °· You are unable to keep fluids down. °· Your abdominal pain gets worse. °· You have a fever. °· You do not feel better after 1 week. °· You have any other questions or concerns. °MAKE SURE YOU: °· Understand these instructions. °· Will watch your condition. °· Will get help right away if you are not doing well or get worse. °Document  Released: 10/12/2001 Document Revised: 04/18/2012 Document Reviewed: 12/01/2011 °ExitCare® Patient Information ©2015 [employer]. This information is not intended to replace advice given to you by your health care provider. Make sure you discuss any questions you have with your health care provider. ° ° °Emergency Department Resource Guide °1) Find a Doctor and Pay Out of Pocket °Although you won't have to find out who is covered by your insurance plan, it is a good idea to ask around and get recommendations. You will then need to call the office and see if the doctor you have chosen will accept you as a new patient and what types of options they offer for patients who are self-pay. Some doctors offer discounts or will set up payment plans for their patients who do not have insurance, but you will need to ask so you aren't surprised when you get to your appointment. ° °2) Contact Your Local Health Department °Not all health departments have doctors that can see patients for sick visits, but many do, so it is worth a call to see if yours does. If you don't know where your local health department is, you can check in your phone book. The CDC also has a tool to help you locate your state's health department, and many state websites also have listings of all of their local health departments. ° °3) Find a Walk-in Clinic °If your illness is not likely to be very severe or complicated, you may want to try a walk in clinic. These are popping up all over the country in pharmacies, drugstores, and shopping centers. They're usually staffed by nurse practitioners or physician assistants that have been trained to treat common illnesses and complaints. They're usually fairly quick and inexpensive. However, if you have serious medical issues or chronic medical problems, these are probably not your best option. ° °No Primary Care Doctor: °- Call [facility] at  832-8000 - they can help you locate a primary care doctor that   accepts your insurance, provides certain services, etc. °- Physician Referral Service- 1-800-533-3463 ° °Chronic Pain Problems: °Organization         Address  Phone   Notes  °[facility]  ([phone]) [phone] Patients need to be referred by their primary care doctor.  ° °Medication Assistance: °Organization         Address  Phone   Notes  °[place] [facility] [address] °[place], [place] [address] (336) 641-8030 --Must be a resident of [place] °-- Must have NO insurance coverage whatsoever (no Medicaid/ Medicare, etc.) °-- The pt. MUST have a primary care doctor that directs their care regularly and follows them in the community °  °[facility]  (866) 331-1348   °[employer]  (888) 892-1162   ° °Agencies that provide inexpensive medical care: °Organization         Address  Phone   Notes  °[facility]  (336) 832-8035   °  [facility]    (336) 832-7272   °[facility] [address] °[place], [place] [address] (336) 832-4777   °[facility] [address], °[place] (336) 271-4999   °[facility]    (336) 373-0678   °[facility]    (336) 272-1050   °[facility] ° [address], [place] Phone:  (336) 832-4444, Fax:  (336) 832-4440 Hours of Operation:  9 am - 6 pm, M-F.  Also accepts Medicaid/Medicare and self-pay.  °[facility] ° [address], [place] Phone: (336) [phone], Fax: (336) [phone]. Hours of Operation:  8:30 am - 5:30 pm, M-F.  Also accepts Medicaid and self-pay.  °[facility] [place] [address], [place] Phone: (336) 878-6027   °[facility] [address], [place], [place] (336)723-1848, Ext. 123 Mondays & Thursdays: 7-9 AM.  First 15 patients are seen on a first come, first serve basis. °  ° °Medicaid-accepting [place] Providers: ° °Organization          Address  Phone   Notes  °[facility] [address], [place] ([phone]) [phone] Also accepts self-pay patients.  °[facility] [address], [place] ° (336) [phone]   °[facility] [address], [place] (336) 288-8857   °[facility] [address] [place] [address], [place] (336) 299-7000   °Veita Bland [address], [place]  ° (336) [phone] Only accepts [place] Access Medicaid patients after they have their name applied to their card.  ° °Self-Pay (no insurance) in [place]: ° °Organization         Address  Phone   Notes  °Sickle Cell Patients, [facility] [address], [place] (336) 832-1970   °[facility] [address], [place] (336) 832-4400   ° [facility] ° [address], [place] (336) 992-4800   °[facility]/Dr. Osei-Bonsu ° [address] [place] [address], [place] or [address], [place] (336) [phone] Phone number for both [place] and [place] locations is the same.  °[facility] [address], [place] (336) [phone]   °[facility] [address] [place] [address], [place] or [address] (336) 852-7530 °(336) 878-2260   °[facility] [address], [place] (336) 350-1642, phone; (336) [phone], fax Sees patients 1st and 3rd Saturday of every month.  Must not qualify for public or private insurance (i.e. Medicaid, Medicare, [place] Health Choice, Veterans' Benefits) • Household income should be no more than 200% of the poverty level •The clinic cannot treat you if you are pregnant or think you are pregnant • Sexually transmitted diseases are not treated at the clinic.  ° ° °Dental Care: °Organization         Address  Phone  Notes  °[facility] [address],  [place] (336) 641-6152 Accepts children up to age 21 who are enrolled in Medicaid or [place] Health Choice; pregnant women with a Medicaid card; and children who have applied for Medicaid or [place] Health Choice, but were declined, whose parents can pay a reduced fee at time of service.  °[facility] [place]  [address],   [place] [phone] Accepts children up to age 72 who are enrolled in Medicaid or [place] Health Choice; pregnant women with a Medicaid card; and children who have applied for Medicaid or [place] Health Choice, but were declined, whose parents can pay a reduced fee at time of service.  [facility]  [address] [place], [place] ([phone] Patients are seen by appointment only. Walk-ins are not accepted. [facility] will see patients 17 years of age and older. Monday - Tuesday (8am-5pm) Most Wednesdays (8:30-5pm) $30 per visit, cash only  [facility]  [address], [facility] [phone] Patients are seen by appointment only. Walk-ins are not accepted. [facility] will see patients 39 years of age and older. One Wednesday Evening (Monthly: Volunteer Based).  $30 per visit, cash only  [employer]  (501) 485-6777 for adults; Children under age 31, call Graduate Pediatric Dentistry at [phone]. Children aged 5-14, please call [phone] to request a pediatric application.  Dental services are provided in all areas of dental care including fillings, crowns and bridges, complete and partial dentures, implants, gum treatment, root canals, and extractions. Preventive care is also provided. Treatment is provided to both adults and children. Patients are selected via a lottery and there is often a waiting list.   [facility] [address], [place]  [phone] [email or web address]   [facility] [address] [place], [place]  [phone], Ext. [phone] Second and Fourth Thursday of each month, opens at 6:30 AM; Clinic ends at 9 AM.  Patients are seen on a first-come first-served basis, and a limited number are seen during each clinic.   [facility]  [address] [place], [place] [phone]   Eligibility Requirements You must have lived in [place], [place], or [place] counties for at least the last three months.   You cannot be eligible for state or federal sponsored [place], including [employer], [place], or [employer].   You generally cannot be eligible for healthcare insurance through your employer.    How to apply: Eligibility screenings are held every Tuesday and Wednesday afternoon from 1:00 pm until 4:00 pm. You do not need an appointment for the interview!  [facility] [address] [place], [place] [phone]   [facility]  [phone]   [facility]  [phone]   [facility]  (985)366-3101    Behavioral Health Resources in the Community: Intensive Outpatient Programs Organization         Address  Phone  Notes  [facility] [address], [place], [place] [phone]   [facility] [address] [place], [place] [phone]   [facility] [address], [place], [place]  619-509-3267   [facility] [address]  [place], [place] 1-245-809-9833 or [phone]   Substance Abuse Resources Organization         Address  Phone  Notes  [facility]  217-283-2331   [facility]  984-840-2675   [facility] [place]  (843)082-5113   Floydene Flock  856-653-4862   Residential & Outpatient Substance Abuse Program  (503)333-9857   Psychological Services Organization         Address  Phone  Notes  [facility]  336(312)256-2455   [facility]  (519) [phone]  [place] [address], [place] 1-800-853-5163 or 336-641-4981   ° °Mobile Crisis Teams °Organization         Address  Phone  Notes  °[facility], Mobile Crisis Care Unit  1-877-626-1772   °Assertive °[facility] ° [address] [place], [place] 336-834-9664   °Sharon DeEsch [address] °[place] 336-554-5454   ° °Self-Help/Support Groups °Organization         Address  Phone             Notes  °[facility] [place] - variety of support groups  336- [phone] Call for more information  °Narcotics Anonymous (NA), [facility] [address], °[place]  2 meetings at this location  ° °Residential Treatment Programs °Organization         Address  Phone  Notes  °[facility] [address],    °[place]  1-866-801-8205   °[facility] ° [address], [place], [place] [phone]   °[facility] [address], [place] [phone] Admissions: 8am-3pm M-F  °[facility] [address]    °[place], [place] 336-841-1104   °[facility] [address], [place], [place] 336-379-7146   °[facility] [address]  °[place], [place] 336-285-9073   °[facility] - Intensive Outpatient [address], [place], [place] 336-852-3033   °[facility] [address]  °[place], [place] 1-877-615-2722 or 336-784-9470   °[facility] ([facility] [address] [place], [place] 336-227-7417 Accepts Medicaid  °[facility] [address]  °[place] 1-800-659-3381 Substance Abuse/Addiction Treatment  ° °[place] Behavioral Health Resources °Organization         Address  Phone  Notes  °[facility]  (888) 581-9988   °Julie Brannon, PhD [address] [place], [place]   (336) 349-5553 or (336) 951-0000   °[facility]   [address] °[place], [place] (336) 349-4454   °[facility] [address], [place], [place] (336) 342-8316 Insurance/Medicaid/sponsorship through [facility]  °[facility] [address]                                    [place], [place] (336) [phone] Therapy/tele-psych/case  °[facility] [address]  ° [place],  (336) 349-2233    °Dr. Arfeen  (336) 349-4544   °[facility] [place]  [employer] [place] [facility] 1) [address], [place] °2) [address], [place] °3)  [address], [place] (336) 349-3220 °(336) 342-7768 ° °(336) 342-8140   °[place] Child Abuse Hotline (336) 342-1394 or (336) 342-3537 (After Hours)    ° ° ° °

## 2015-04-29 NOTE — ED Provider Notes (Signed)
CSN: [id number]     Arrival date & time 04/29/15  1419 History   First MD Initiated Contact with Patient 04/29/15 1628     Chief Complaint  Patient presents with  . Abdominal Pain  . Emesis     (Consider location/radiation/quality/duration/timing/severity/associated sxs/prior Treatment) HPI 5 days of abdominal pain. Predominantly epigastric and burning in quality. Patient reports multiple of symptoms of vomiting and vomiting with any attempt at by mouth intake. No diarrhea or constipation. No fever. No significant vaginal discharge. She is sexually active. She reports sexual intercourse about a week ago with some postcoital bleeding for 3 days but no other abnormal vaginal discharge. No history of similar pain. History reviewed. No pertinent past medical history. History reviewed. No pertinent past surgical history. History reviewed. No pertinent family history. History  Substance Use Topics  . Smoking status: Never Smoker   . Smokeless tobacco: Not on file  . Alcohol Use: No   OB History    No data available     Review of Systems 10 Systems reviewed and are negative for acute change except as noted in the HPI.    Allergies  Review of patient's allergies indicates no known allergies.  Home Medications   Prior to Admission medications   Medication Sig Start Date End Date Taking? Authorizing Provider  ondansetron (ZOFRAN ODT) 4 MG disintegrating tablet Take 1 tablet (4 mg total) by mouth every 8 (eight) hours as needed for nausea or vomiting. 04/26/15  Yes Lonia Skinner Sofia, PA-C  omeprazole (PRILOSEC) 20 MG capsule Take 1 capsule (20 mg total) by mouth daily. 04/29/15   Arby Barrette, MD  promethazine (PHENERGAN) 25 MG tablet Take 1 tablet (25 mg total) by mouth every 6 (six) hours as needed for nausea or vomiting. 04/29/15   Arby Barrette, MD  traMADol (ULTRAM) 50 MG tablet Take 2 tablets (100 mg total) by mouth every 6 (six) hours as needed. 04/29/15   Arby Barrette, MD   BP  113/61 mmHg  Pulse 69  Temp(Src) 98.2 F (36.8 C) (Oral)  Resp 18  SpO2 99%  LMP 04/11/2015 Physical Exam  Constitutional: She is oriented to person, place, and time.  Patient is alert and nontoxic. She is hyperventilating. Mental status is clear. She is having some carpal pedal spasm in association with hyperventilation.  HENT:  Head: Normocephalic and atraumatic.  Eyes: EOM are normal. Pupils are equal, round, and reactive to light.  Neck: Neck supple.  Cardiovascular: Normal rate, regular rhythm, normal heart sounds and intact distal pulses.   Pulmonary/Chest: Effort normal and breath sounds normal.  Abdominal: Soft. Bowel sounds are normal. She exhibits no distension. There is tenderness.  Epigastric pain to palpation without guarding. Mild diffuse lower abdominal pain to palpation without guarding. No palpable mass. Mild bilateral CVA tenderness.  Genitourinary: Vagina normal and uterus normal. No vaginal discharge found.  No pelvic tenderness to palpation no mass or fullness. No cervical motion tenderness  Musculoskeletal: Normal range of motion. She exhibits no edema.  Carpal spasm.  Neurological: She is alert and oriented to person, place, and time. She has normal strength. Coordination normal. GCS eye subscore is 4. GCS verbal subscore is 5. GCS motor subscore is 6.  Skin: Skin is warm, dry and intact.  Psychiatric: She has a normal mood and affect.    ED Course  Procedures (including critical care time) Labs Review Labs Reviewed  CBC WITH DIFFERENTIAL/PLATELET - Abnormal; Notable for the following:    WBC 13.8 (*)  RBC 5.26 (*)    Hemoglobin 15.5 (*)    Platelets 411 (*)    Neutrophils Relative % 89 (*)    Neutro Abs 12.3 (*)    Lymphocytes Relative 8 (*)    All other components within normal limits  COMPREHENSIVE METABOLIC PANEL - Abnormal; Notable for the following:    Potassium 3.3 (*)    Glucose, Bld 135 (*)    Total Protein 8.3 (*)    Albumin 5.2 (*)     Anion gap 16 (*)    All other components within normal limits  URINALYSIS, ROUTINE W REFLEX MICROSCOPIC (NOT AT [facility]) - Abnormal; Notable for the following:    Hgb urine dipstick TRACE (*)    Ketones, ur >80 (*)    All other components within normal limits  URINE MICROSCOPIC-ADD ON - Abnormal; Notable for the following:    Squamous Epithelial / LPF FEW (*)    All other components within normal limits  WET PREP, GENITAL  LIPASE, BLOOD  PREGNANCY, URINE  POC URINE PREG, ED  GC/CHLAMYDIA PROBE AMP ([place]) NOT AT [facility]    Imaging Review [place] Abdomen Limited  04/29/2015   CLINICAL DATA:  Four day history of upper abdominal pain and vomiting  EXAM: US ABDOMEN LIMITED - RIGHT UPPER QUADRANT  COMPARISON:  CT abdomen and pelvis April 25, 2015  FINDINGS: Gallbladder:  Within the gallbladder, there is a 3 mm echogenic focus which neither moves nor shadows consistent with a small polyp. There are no echogenic foci in the gallbladder which move and shadow as would be expected with gallstones. There is no gallbladder wall thickening or pericholecystic fluid. No sonographic Murphy sign noted.  Common bile duct:  Diameter: 3 mm. There is no intrahepatic or extrahepatic biliary duct dilatation.  Liver:  No focal lesion identified. Within normal limits in parenchymal echogenicity.  IMPRESSION: 3 mm polyp in the gallbladder. Gallbladder otherwise appears unremarkable. This finding within the gallbladder may warrant a followup study in approximately 1 year to assess for stability. Study otherwise unremarkable.   Electronically Signed   By: Bretta BangWilliam  Woodruff III M.D.   On: 04/29/2015 20:19     EKG Interpretation None      MDM   Final diagnoses:  Epigastric pain  Gastritis   Patient's pain is epigastric in nature ultrasound does not show acute cholecystitis or biliary colic. Consideration is for peptic ulcer disease or gastritis. At this point there does not appear to be any acute surgical etiology. The  patient did get good pain relief with Protonix, half a milligram Dilaudid and fluid hydration. Patient will be started on daily Prilosec and given a small lot up when necessary tramadol for pain control. She is advised she must have follow-up this week for recheck. Earlier she had had elevated white count of this is decreasing. Her vital signs are stable and she is nontoxic.    Arby BarretteMarcy Peola Joynt, MD 04/29/15 [phone]

## 2015-04-29 NOTE — ED Notes (Signed)
Pt c/o generalized abdominal pain and emesis x 4 days.  Pain score 8/10.  [facility] same and reports not feeling any better.  Last dose of zofran around 0600.

## 2015-04-30 ENCOUNTER — Encounter (HOSPITAL_COMMUNITY): Payer: Self-pay

## 2015-04-30 ENCOUNTER — Inpatient Hospital Stay (HOSPITAL_COMMUNITY)
Admission: EM | Admit: 2015-04-30 | Discharge: 2015-05-06 | DRG: 882 | Disposition: A | Discharge status: Home / Self Care | Payer: BLUE CROSS/BLUE SHIELD | Attending: Internal Medicine | Admitting: Internal Medicine

## 2015-04-30 DIAGNOSIS — E876 Hypokalemia: Secondary | ICD-10-CM | POA: Diagnosis present

## 2015-04-30 DIAGNOSIS — F329 Major depressive disorder, single episode, unspecified: Secondary | ICD-10-CM | POA: Diagnosis present

## 2015-04-30 DIAGNOSIS — K219 Gastro-esophageal reflux disease without esophagitis: Secondary | ICD-10-CM

## 2015-04-30 DIAGNOSIS — IMO0002 Reserved for concepts with insufficient information to code with codable children: Secondary | ICD-10-CM

## 2015-04-30 DIAGNOSIS — N72 Inflammatory disease of cervix uteri: Secondary | ICD-10-CM | POA: Diagnosis present

## 2015-04-30 DIAGNOSIS — D72829 Elevated white blood cell count, unspecified: Secondary | ICD-10-CM | POA: Diagnosis not present

## 2015-04-30 DIAGNOSIS — R112 Nausea with vomiting, unspecified: Secondary | ICD-10-CM | POA: Diagnosis present

## 2015-04-30 DIAGNOSIS — R1110 Vomiting, unspecified: Secondary | ICD-10-CM

## 2015-04-30 DIAGNOSIS — F450 Somatization disorder: Principal | ICD-10-CM | POA: Diagnosis present

## 2015-04-30 DIAGNOSIS — K824 Cholesterolosis of gallbladder: Secondary | ICD-10-CM | POA: Diagnosis present

## 2015-04-30 DIAGNOSIS — K449 Diaphragmatic hernia without obstruction or gangrene: Secondary | ICD-10-CM | POA: Diagnosis present

## 2015-04-30 DIAGNOSIS — Z91410 Personal history of adult physical and sexual abuse: Secondary | ICD-10-CM

## 2015-04-30 DIAGNOSIS — Z79891 Long term (current) use of opiate analgesic: Secondary | ICD-10-CM

## 2015-04-30 DIAGNOSIS — F410 Panic disorder [episodic paroxysmal anxiety] without agoraphobia: Secondary | ICD-10-CM | POA: Diagnosis present

## 2015-04-30 DIAGNOSIS — F439 Reaction to severe stress, unspecified: Secondary | ICD-10-CM | POA: Diagnosis present

## 2015-04-30 DIAGNOSIS — R1013 Epigastric pain: Secondary | ICD-10-CM

## 2015-04-30 DIAGNOSIS — F4000 Agoraphobia, unspecified: Secondary | ICD-10-CM | POA: Diagnosis present

## 2015-04-30 DIAGNOSIS — F419 Anxiety disorder, unspecified: Secondary | ICD-10-CM | POA: Diagnosis present

## 2015-04-30 DIAGNOSIS — Z79899 Other long term (current) drug therapy: Secondary | ICD-10-CM

## 2015-04-30 DIAGNOSIS — F919 Conduct disorder, unspecified: Secondary | ICD-10-CM | POA: Diagnosis present

## 2015-04-30 DIAGNOSIS — Z659 Problem related to unspecified psychosocial circumstances: Secondary | ICD-10-CM

## 2015-04-30 HISTORY — DX: Anxiety disorder, unspecified: F41.9

## 2015-04-30 HISTORY — DX: Reserved for concepts with insufficient information to code with codable children: IMO0002

## 2015-04-30 HISTORY — DX: Cholesterolosis of gallbladder: K82.4

## 2015-04-30 HISTORY — DX: Major depressive disorder, single episode, unspecified: F32.9

## 2015-04-30 HISTORY — DX: Depression, unspecified: F32.A

## 2015-04-30 LAB — CBC
HCT: 38.4 % (ref 36.0–46.0)
HEMOGLOBIN: 12.9 g/dL (ref 12.0–15.0)
MCH: 28.5 pg (ref 26.0–34.0)
MCHC: 33.6 g/dL (ref 30.0–36.0)
MCV: 84.8 fL (ref 78.0–100.0)
Platelets: 320 10*3/uL (ref 150–400)
RBC: 4.53 MIL/uL (ref 3.87–5.11)
RDW: 12.8 % (ref 11.5–15.5)
WBC: 14.7 10*3/uL — ABNORMAL HIGH (ref 4.0–10.5)

## 2015-04-30 LAB — COMPREHENSIVE METABOLIC PANEL
ALT: 24 U/L (ref 14–54)
AST: 24 U/L (ref 15–41)
Albumin: 5.2 g/dL — ABNORMAL HIGH (ref 3.5–5.0)
Alkaline Phosphatase: 68 U/L (ref 38–126)
Anion gap: 16 — ABNORMAL HIGH (ref 5–15)
BILIRUBIN TOTAL: 1.2 mg/dL (ref 0.3–1.2)
BUN: 9 mg/dL (ref 6–20)
CHLORIDE: 101 mmol/L (ref 101–111)
CO2: 19 mmol/L — AB (ref 22–32)
Calcium: 9.7 mg/dL (ref 8.9–10.3)
Creatinine, Ser: 0.84 mg/dL (ref 0.44–1.00)
Glucose, Bld: 131 mg/dL — ABNORMAL HIGH (ref 65–99)
Potassium: 3 mmol/L — ABNORMAL LOW (ref 3.5–5.1)
SODIUM: 136 mmol/L (ref 135–145)
Total Protein: 8.5 g/dL — ABNORMAL HIGH (ref 6.5–8.1)

## 2015-04-30 LAB — RAPID URINE DRUG SCREEN, HOSP PERFORMED
AMPHETAMINES: NOT DETECTED
BARBITURATES: NOT DETECTED
BENZODIAZEPINES: NOT DETECTED
COCAINE: NOT DETECTED
Opiates: NOT DETECTED
Tetrahydrocannabinol: POSITIVE — AB

## 2015-04-30 LAB — CREATININE, SERUM
CREATININE: 0.76 mg/dL (ref 0.44–1.00)
GFR calc non Af Amer: >60 mL/min (ref >60)

## 2015-04-30 LAB — CBC WITH DIFFERENTIAL/PLATELET
BASOS ABS: 0 10*3/uL (ref 0.0–0.1)
Basophils Relative: 0 % (ref 0–1)
EOS ABS: 0 10*3/uL (ref 0.0–0.7)
Eosinophils Relative: 0 % (ref 0–5)
HCT: 44.3 % (ref 36.0–46.0)
Hemoglobin: 15.4 g/dL — ABNORMAL HIGH (ref 12.0–15.0)
Lymphocytes Relative: 9 % — ABNORMAL LOW (ref 12–46)
Lymphs Abs: 1.7 10*3/uL (ref 0.7–4.0)
MCH: 28.8 pg (ref 26.0–34.0)
MCHC: 34.8 g/dL (ref 30.0–36.0)
MCV: 83 fL (ref 78.0–100.0)
Monocytes Absolute: 0.9 10*3/uL (ref 0.1–1.0)
Monocytes Relative: 5 % (ref 3–12)
NEUTROS PCT: 86 % — AB (ref 43–77)
Neutro Abs: 15.8 10*3/uL — ABNORMAL HIGH (ref 1.7–7.7)
PLATELETS: 380 10*3/uL (ref 150–400)
RBC: 5.34 MIL/uL — ABNORMAL HIGH (ref 3.87–5.11)
RDW: 12.9 % (ref 11.5–15.5)
WBC: 18.4 10*3/uL — ABNORMAL HIGH (ref 4.0–10.5)

## 2015-04-30 LAB — URINALYSIS, ROUTINE W REFLEX MICROSCOPIC
BILIRUBIN URINE: NEGATIVE
GLUCOSE, UA: NEGATIVE mg/dL
Ketones, ur: 40 mg/dL — AB
Leukocytes, UA: NEGATIVE
NITRITE: NEGATIVE
PH: 7 (ref 5.0–8.0)
PROTEIN: NEGATIVE mg/dL
SPECIFIC GRAVITY, URINE: 1.009 (ref 1.005–1.030)
UROBILINOGEN UA: 1 mg/dL (ref 0.0–1.0)

## 2015-04-30 LAB — GC/CHLAMYDIA PROBE AMP (~~LOC~~) NOT AT ARMC
CHLAMYDIA, DNA PROBE: NEGATIVE
Neisseria Gonorrhea: NEGATIVE

## 2015-04-30 LAB — URINE MICROSCOPIC-ADD ON

## 2015-04-30 LAB — LIPASE, BLOOD: Lipase: 31 U/L (ref 22–51)

## 2015-04-30 LAB — MAGNESIUM: Magnesium: 1.7 mg/dL (ref 1.7–2.4)

## 2015-04-30 LAB — PHOSPHORUS: Phosphorus: 3.2 mg/dL (ref 2.5–4.6)

## 2015-04-30 MED ORDER — PANTOPRAZOLE SODIUM 40 MG PO TBEC
40.0000 mg | DELAYED_RELEASE_TABLET | Freq: Every day | ORAL | Status: DC
  Administered 2015-05-01: 40 mg via ORAL
  Filled 2015-04-30: qty 1

## 2015-04-30 MED ORDER — PROMETHAZINE HCL 25 MG/ML IJ SOLN
12.5000 mg | Freq: Once | INTRAMUSCULAR | Status: AC
  Administered 2015-04-30: 12.5 mg via INTRAVENOUS
  Filled 2015-04-30: qty 1

## 2015-04-30 MED ORDER — OXYCODONE HCL 5 MG PO TABS
5.0000 mg | ORAL_TABLET | ORAL | Status: DC | PRN
  Administered 2015-04-30 – 2015-05-03 (×7): 5 mg via ORAL
  Filled 2015-04-30 (×7): qty 1

## 2015-04-30 MED ORDER — POTASSIUM CHLORIDE CRYS ER 20 MEQ PO TBCR
40.0000 meq | EXTENDED_RELEASE_TABLET | Freq: Once | ORAL | Status: AC
  Administered 2015-04-30: 40 meq via ORAL
  Filled 2015-04-30: qty 2

## 2015-04-30 MED ORDER — HEPARIN SODIUM (PORCINE) 5000 UNIT/ML IJ SOLN
5000.0000 [IU] | Freq: Three times a day (TID) | INTRAMUSCULAR | Status: DC
  Administered 2015-04-30 – 2015-05-02 (×5): 5000 [IU] via SUBCUTANEOUS
  Filled 2015-04-30 (×7): qty 1

## 2015-04-30 MED ORDER — SODIUM CHLORIDE 0.9 % IJ SOLN
3.0000 mL | Freq: Two times a day (BID) | INTRAMUSCULAR | Status: DC
  Administered 2015-05-02 – 2015-05-05 (×5): 3 mL via INTRAVENOUS

## 2015-04-30 MED ORDER — KCL IN DEXTROSE-NACL 20-5-0.45 MEQ/L-%-% IV SOLN
INTRAVENOUS | Status: DC
  Administered 2015-04-30 – 2015-05-05 (×9): via INTRAVENOUS
  Filled 2015-04-30 (×10): qty 1000

## 2015-04-30 MED ORDER — CETYLPYRIDINIUM CHLORIDE 0.05 % MT LIQD
7.0000 mL | Freq: Two times a day (BID) | OROMUCOSAL | Status: DC
  Administered 2015-04-30 – 2015-05-05 (×8): 7 mL via OROMUCOSAL

## 2015-04-30 MED ORDER — METOCLOPRAMIDE HCL 5 MG/ML IJ SOLN
10.0000 mg | Freq: Once | INTRAMUSCULAR | Status: AC
  Administered 2015-04-30: 10 mg via INTRAVENOUS
  Filled 2015-04-30: qty 2

## 2015-04-30 MED ORDER — ONDANSETRON HCL 4 MG/2ML IJ SOLN
4.0000 mg | Freq: Four times a day (QID) | INTRAMUSCULAR | Status: DC | PRN
  Administered 2015-05-01 – 2015-05-03 (×9): 4 mg via INTRAVENOUS
  Filled 2015-04-30 (×10): qty 2

## 2015-04-30 MED ORDER — HYDROMORPHONE HCL 1 MG/ML IJ SOLN
0.5000 mg | Freq: Once | INTRAMUSCULAR | Status: AC
  Administered 2015-04-30: 0.5 mg via INTRAVENOUS
  Filled 2015-04-30: qty 1

## 2015-04-30 MED ORDER — ONDANSETRON HCL 4 MG PO TABS
4.0000 mg | ORAL_TABLET | Freq: Four times a day (QID) | ORAL | Status: DC | PRN
  Administered 2015-05-04 – 2015-05-05 (×2): 4 mg via ORAL
  Filled 2015-04-30 (×3): qty 1

## 2015-04-30 MED ORDER — SODIUM CHLORIDE 0.9 % IV BOLUS (SEPSIS)
1000.0000 mL | Freq: Once | INTRAVENOUS | Status: AC
  Administered 2015-04-30: 1000 mL via INTRAVENOUS

## 2015-04-30 MED ORDER — PANTOPRAZOLE SODIUM 40 MG IV SOLR
40.0000 mg | Freq: Once | INTRAVENOUS | Status: AC
Start: 2015-04-30 — End: 2015-04-30
  Administered 2015-04-30: 40 mg via INTRAVENOUS
  Filled 2015-04-30: qty 40

## 2015-04-30 MED ORDER — ONDANSETRON HCL 4 MG/2ML IJ SOLN
4.0000 mg | Freq: Once | INTRAMUSCULAR | Status: AC
  Administered 2015-04-30: 4 mg via INTRAVENOUS
  Filled 2015-04-30: qty 2

## 2015-04-30 NOTE — ED Notes (Signed)
Bed: [id number] Expected date:  Expected time:  Means of arrival:  Comments: EMS- N/V x 6

## 2015-04-30 NOTE — ED Notes (Signed)
ED PA at bedside

## 2015-04-30 NOTE — ED Provider Notes (Signed)
CSN: [id number]     Arrival date & time 04/30/15  1137 History   First MD Initiated Contact with Patient 04/30/15 1155     Chief Complaint  Patient presents with  . Abdominal Pain  . Nausea  . Emesis     (Consider location/radiation/quality/duration/timing/severity/associated sxs/prior Treatment) HPI Stacey Russell is a 19 y.o. female with no medical problems, presents to ED with complaint of abdominal pain, nausea and vomiting. Patient states that her symptoms started 6 days ago. She states in the past 3 days this is her third visit to emergency department. She states she is unable to eat or drink at home. She states she vomits after every time she eats something by mouth. She reports epigastric abdominal pain. Abdominal pain does not radiate. There is no pain in her back. She denies any fever or chills. She denies any blood in her stool or emesis. She denies any diarrhea. She denies urinary symptoms. She has been prescribed multiple medications in the last 6 days including Protonix, antibiotics, pain medications, she states she takes them, but she thinks she vomits them every time. She has not had any relief after taking them. She denies history of the same. Patient is daily marijuana smoker but states she has not had any last 6 days. She denies any other drugs or alcohol.  History reviewed. No pertinent past medical history. History reviewed. No pertinent past surgical history. No family history on file. History  Substance Use Topics  . Smoking status: Never Smoker   . Smokeless tobacco: Not on file  . Alcohol Use: No   OB History    No data available     Review of Systems  Constitutional: Negative for fever and chills.  Respiratory: Negative for cough, chest tightness and shortness of breath.   Cardiovascular: Negative for chest pain, palpitations and leg swelling.  Gastrointestinal: Positive for nausea, vomiting and abdominal pain. Negative for diarrhea.  Genitourinary:  Negative for dysuria, flank pain and pelvic pain.  Musculoskeletal: Negative for myalgias, arthralgias, neck pain and neck stiffness.  Skin: Negative for rash.  Neurological: Negative for dizziness, weakness and headaches.  All other systems reviewed and are negative.     Allergies  Review of patient's allergies indicates no known allergies.  Home Medications   Prior to Admission medications   Medication Sig Start Date End Date Taking? Authorizing Provider  omeprazole (PRILOSEC) 20 MG capsule Take 1 capsule (20 mg total) by mouth daily. 04/29/15  Yes Arby BarretteMarcy Pfeiffer, MD  ondansetron (ZOFRAN ODT) 4 MG disintegrating tablet Take 1 tablet (4 mg total) by mouth every 8 (eight) hours as needed for nausea or vomiting. 04/26/15  Yes Elson AreasLeslie K Sofia, PA-C  promethazine (PHENERGAN) 25 MG tablet Take 1 tablet (25 mg total) by mouth every 6 (six) hours as needed for nausea or vomiting. 04/29/15  Yes Arby BarretteMarcy Pfeiffer, MD  traMADol (ULTRAM) 50 MG tablet Take 2 tablets (100 mg total) by mouth every 6 (six) hours as needed. Patient taking differently: Take 100 mg by mouth every 6 (six) hours as needed (pain).  04/29/15  Yes Arby BarretteMarcy Pfeiffer, MD   BP 171/100 mmHg  Pulse 60  Temp(Src) 97.9 F (36.6 C) (Oral)  Resp 20  Ht 5\' 5"  (1.651 m)  Wt 160 lb (72.576 kg)  BMI 26.63 kg/m2  SpO2 100%  LMP 04/11/2015 Physical Exam  Constitutional: She is oriented to person, place, and time. She appears well-developed and well-nourished. No distress.  HENT:  Head: Normocephalic.  Eyes: Conjunctivae  are normal.  Neck: Neck supple.  Cardiovascular: Normal rate, regular rhythm and normal heart sounds.   Pulmonary/Chest: Effort normal and breath sounds normal. No respiratory distress. She has no wheezes. She has no rales.  Abdominal: Soft. Bowel sounds are normal. She exhibits no distension. There is tenderness. There is no rebound.  Epigastric tenderness  Musculoskeletal: She exhibits no edema.  Neurological: She is  alert and oriented to person, place, and time.  Skin: Skin is warm and dry.  Psychiatric: She has a normal mood and affect. Her behavior is normal.  Nursing note and vitals reviewed.   ED Course  Procedures (including critical care time) Labs Review Labs Reviewed  CBC WITH DIFFERENTIAL/PLATELET  COMPREHENSIVE METABOLIC PANEL  LIPASE, BLOOD  URINALYSIS, ROUTINE W REFLEX MICROSCOPIC (NOT AT [facility])  URINE RAPID DRUG SCREEN, HOSP PERFORMED    Imaging Review US Abdomen Limited  04/29/2015   CLINICAL DATA:  Four day history of upper abdominal pain and vomiting  EXAM: US ABDOMEN LIMITED - RIGHT UPPER QUADRANT  COMPARISON:  CT abdomen and pelvis April 25, 2015  FINDINGS: Gallbladder:  Within the gallbladder, there is a 3 mm echogenic focus which neither moves nor shadows consistent with a small polyp. There are no echogenic foci in the gallbladder which move and shadow as would be expected with gallstones. There is no gallbladder wall thickening or pericholecystic fluid. No sonographic Murphy sign noted.  Common bile duct:  Diameter: 3 mm. There is no intrahepatic or extrahepatic biliary duct dilatation.  Liver:  No focal lesion identified. Within normal limits in parenchymal echogenicity.  IMPRESSION: 3 mm polyp in the gallbladder. Gallbladder otherwise appears unremarkable. This finding within the gallbladder may warrant a followup study in approximately 1 year to assess for stability. Study otherwise unremarkable.   Electronically Signed   By: Bretta Bang III M.D.   On: 04/29/2015 20:19     EKG Interpretation None      MDM   Final diagnoses:  Intractable vomiting with nausea, vomiting of unspecified type  Epigastric pain    1:00 PM Patient emergency department with persistent nausea, vomiting, abdominal pain. This is her third visit in 6 days for the same. She has been taking Zofran, Prilosec, tramadol, Phenergan at home with no relief of her symptoms. She states she is unable to  keep any fluids or solids down, unable to keep her medications down. She does not have history of the same. No prior abdominal surgeries. She has had thorough evaluation including CT and ultrasound of the abdomen the last 6 days which both came back unremarkable.  Will start IV fluids, ordered zofran and reglan.   2:50 PM Patient has not had relief with Zofran and Reglan, Phenergan ordered. Patient states that she is feeling slightly better after Phenergan. She's no longer vomiting. Patient is concerned about going home given she has had persistent vomiting for 6 days and has had 3 visits to the ER. States she is unable to drink or eat anything. Her labs confirm dehydration, with elevated hemoglobin and white blood cell count. Potassium is 3.0, will replenish with 40 by mouth. I spoke with a tight hospitalist, thing this patient may benefit from admission and IV hydration until her symptoms improve. The admitting physician will come by and look at the patient before making a decision to admit her.   Jaynie Crumble, PA-C 05/01/15 1445  Toy Cookey, MD 05/02/15 1204

## 2015-04-30 NOTE — H&P (Signed)
[facility] History and Physical  Stacey HamburgMorgan Wurzel ZOX:[id number] DOB: 12/27/1995 DOA: 04/30/2015  Referring physician: ED personnel PCP: Pcp Not In System   Chief Complaint: nausea and emesis  HPI: Stacey Russell is a 19 y.o. female  With recent history of nausea and emesis presents for the third time to the hospital secondary to above. She states that the problems has been present for the last week or so. Nothing she is aware of makes it better. The problem has progressively gotten worse and as a result patient has not been able to eat or keep her oral medications down. Were consulted for further medical evaluation and recommendations.   Review of Systems:  Constitutional:  No weight loss, night sweats, Fevers, chills, fatigue.  HEENT:  No headaches, Difficulty swallowing,Tooth/dental problems,Sore throat,  No sneezing, itching, ear ache, nasal congestion, post nasal drip,  Cardio-vascular:  No chest pain, Orthopnea, PND, swelling in lower extremities, anasarca, dizziness, palpitations  GI:  No heartburn, indigestion, abdominal pain, + nausea,+ vomiting, diarrhea, change in bowel habits, loss of appetite  Resp:  No shortness of breath with exertion or at rest. No excess mucus, no productive cough, No non-productive cough, No coughing up of blood.No change in color of mucus.No wheezing.No chest wall deformity  Skin:  no rash or lesions.  GU:  no dysuria, change in color of urine, no urgency or frequency. No flank pain.  Musculoskeletal:  No joint pain or swelling. No decreased range of motion. No back pain.  Psych:  No change in mood or affect. No depression or anxiety. No memory loss.   History reviewed. No pertinent past medical history. History reviewed. No pertinent past surgical history. Social History:  reports that she has never smoked. She does not have any smokeless tobacco history on file. She reports that she does not drink alcohol or use illicit drugs.  No  Known Allergies  Family history - Reports history of ulcers  Prior to Admission medications   Medication Sig Start Date End Date Taking? Authorizing Provider  omeprazole (PRILOSEC) 20 MG capsule Take 1 capsule (20 mg total) by mouth daily. 04/29/15  Yes Arby BarretteMarcy Pfeiffer, MD  ondansetron (ZOFRAN ODT) 4 MG disintegrating tablet Take 1 tablet (4 mg total) by mouth every 8 (eight) hours as needed for nausea or vomiting. 04/26/15  Yes Elson AreasLeslie K Sofia, PA-C  promethazine (PHENERGAN) 25 MG tablet Take 1 tablet (25 mg total) by mouth every 6 (six) hours as needed for nausea or vomiting. 04/29/15  Yes Arby BarretteMarcy Pfeiffer, MD  traMADol (ULTRAM) 50 MG tablet Take 2 tablets (100 mg total) by mouth every 6 (six) hours as needed. Patient taking differently: Take 100 mg by mouth every 6 (six) hours as needed (pain).  04/29/15  Yes Arby BarretteMarcy Pfeiffer, MD   Physical Exam: Filed Vitals:   04/30/15 1140 04/30/15 1156 04/30/15 1400 04/30/15 1503  BP: 186/108 171/100 114/56 128/66  Pulse: 68 60 61 61  Temp: 97.9 F (36.6 C)  97.9 F (36.6 C)   TempSrc: Oral  Oral   Resp: 20  16 23   Height:  5\' 5"  (1.651 m)    Weight:  72.576 kg (160 lb)    SpO2: 100%  98% 100%    Wt Readings from Last 3 Encounters:  04/30/15 72.576 kg (160 lb)    General:  Appears calm and comfortable Eyes: PERRL, normal lids, irises & conjunctiva ENT: grossly normal hearing, lips & tongue Neck: no LAD, masses or thyromegaly Cardiovascular: RRR, no m/r/g. No LE  edema. Respiratory: CTA bilaterally, no w/r/r. Normal respiratory effort. Abdomen: soft, nt, nd Skin: no rash or induration seen on limited exam Musculoskeletal: grossly normal tone BUE/BLE Psychiatric: grossly normal mood and affect, speech fluent and appropriate Neurologic: grossly non-focal.          Labs on Admission:  Basic Metabolic Panel:  Recent Labs Lab 04/25/15 1929 04/29/15 1518 04/30/15 1250  NA 136 140 136  K 3.7 3.3* 3.0*  CL 101 102 101  CO2 23 22 19*    GLUCOSE 131* 135* 131*  BUN CREATININE 0.62 0.88 0.84  CALCIUM 9.7 9.8 9.7   Liver Function Tests:  Recent Labs Lab 04/25/15 1929 04/29/15 1518 04/30/15 1250  AST ALT ALKPHOS 72 66 68  BILITOT 0.5 0.8 1.2  PROT 8.5* 8.3* 8.5*  ALBUMIN 5.1* 5.2* 5.2*    Recent Labs Lab 04/29/15 1518 04/30/15 1250  LIPASE 23 31   No results for input(s): AMMONIA in the last 168 hours. CBC:  Recent Labs Lab 04/25/15 1841 04/25/15 1929 04/29/15 1518 04/30/15 1250  WBC 8.0 19.0* 13.8* 18.4*  NEUTROABS 5.6 17.6* 12.3* 15.8*  HGB 12.5 14.2 15.5* 15.4*  HCT 39.8 41.0 44.6 44.3  MCV 81.7 84.7 84.8 83.0  PLT 227 344 411* 380   Cardiac Enzymes: No results for input(s): CKTOTAL, CKMB, CKMBINDEX, TROPONINI in the last 168 hours.  BNP (last 3 results) No results for input(s): BNP in the last 8760 hours.  ProBNP (last 3 results) No results for input(s): PROBNP in the last 8760 hours.  CBG: No results for input(s): GLUCAP in the last 168 hours.  Radiological Exams on Admission: US Abdomen Limited  04/29/2015   CLINICAL DATA:  Four day history of upper abdominal pain and vomiting  EXAM: US ABDOMEN LIMITED - RIGHT UPPER QUADRANT  COMPARISON:  CT abdomen and pelvis April 25, 2015  FINDINGS: Gallbladder:  Within the gallbladder, there is a 3 mm echogenic focus which neither moves nor shadows consistent with a small polyp. There are no echogenic foci in the gallbladder which move and shadow as would be expected with gallstones. There is no gallbladder wall thickening or pericholecystic fluid. No sonographic Murphy sign noted.  Common bile duct:  Diameter: 3 mm. There is no intrahepatic or extrahepatic biliary duct dilatation.  Liver:  No focal lesion identified. Within normal limits in parenchymal echogenicity.  IMPRESSION: 3 mm polyp in the gallbladder. Gallbladder otherwise appears unremarkable. This finding within the gallbladder may warrant a followup study in  approximately 1 year to assess for stability. Study otherwise unremarkable.   Electronically Signed   By: Bretta Bang III M.D.   On: 04/29/2015 20:19    Assessment/Plan Active Problems:   Intractable vomiting with nausea - monitor on telemetry - K dur given in ED - Supportive therapy  Hypokalemia - replaced in ED - will place on MIVF's  Leukocytosis - Most likely stress reaction - prior imaging studies do not reports inflammatory processes - reassess next am. If spikes fever will plan on covering with antibiotics and pan culturing - Patient on CT scan had report of cervicitis but this was negative upon pelvic examon my discussion with the ED personnel   Code Status: full DVT Prophylaxis: heparin Family Communication: discussed with patient and family member at bedside Disposition Plan: Pending improvement in condition.  Time spent: > 45 minutes  Penny Pia [facility] Pager [phone]

## 2015-04-30 NOTE — ED Notes (Signed)
Per [facility]- Pt reports N/V/ and  UQ stomach pain x 6 days. [facility] and [facility] for presenting complaint. Pt today states unable to keep anything down.

## 2015-04-30 NOTE — ED Notes (Signed)
MD at bedside. HOSPITALIST PRESENT 

## 2015-05-01 ENCOUNTER — Observation Stay (HOSPITAL_COMMUNITY): Payer: BLUE CROSS/BLUE SHIELD

## 2015-05-01 DIAGNOSIS — R1110 Vomiting, unspecified: Secondary | ICD-10-CM | POA: Diagnosis not present

## 2015-05-01 DIAGNOSIS — D72829 Elevated white blood cell count, unspecified: Secondary | ICD-10-CM | POA: Diagnosis not present

## 2015-05-01 DIAGNOSIS — E876 Hypokalemia: Secondary | ICD-10-CM | POA: Diagnosis not present

## 2015-05-01 LAB — CBC
HEMATOCRIT: 37.8 % (ref 36.0–46.0)
Hemoglobin: 12.8 g/dL (ref 12.0–15.0)
MCH: 29.3 pg (ref 26.0–34.0)
MCHC: 33.9 g/dL (ref 30.0–36.0)
MCV: 86.5 fL (ref 78.0–100.0)
Platelets: 317 10*3/uL (ref 150–400)
RBC: 4.37 MIL/uL (ref 3.87–5.11)
RDW: 13 % (ref 11.5–15.5)
WBC: 11.5 10*3/uL — ABNORMAL HIGH (ref 4.0–10.5)

## 2015-05-01 LAB — BASIC METABOLIC PANEL
ANION GAP: 7 (ref 5–15)
BUN: 6 mg/dL (ref 6–20)
CALCIUM: 8.9 mg/dL (ref 8.9–10.3)
CO2: 26 mmol/L (ref 22–32)
CREATININE: 0.7 mg/dL (ref 0.44–1.00)
Chloride: 106 mmol/L (ref 101–111)
Glucose, Bld: 108 mg/dL — ABNORMAL HIGH (ref 65–99)
POTASSIUM: 3.9 mmol/L (ref 3.5–5.1)
SODIUM: 139 mmol/L (ref 135–145)

## 2015-05-01 MED ORDER — METOCLOPRAMIDE HCL 5 MG/ML IJ SOLN
5.0000 mg | Freq: Three times a day (TID) | INTRAMUSCULAR | Status: DC | PRN
  Administered 2015-05-01 – 2015-05-02 (×3): 5 mg via INTRAVENOUS
  Filled 2015-05-01 (×3): qty 2

## 2015-05-01 MED ORDER — PANTOPRAZOLE SODIUM 40 MG PO TBEC
40.0000 mg | DELAYED_RELEASE_TABLET | Freq: Two times a day (BID) | ORAL | Status: DC
  Administered 2015-05-01 – 2015-05-04 (×6): 40 mg via ORAL
  Filled 2015-05-01 (×8): qty 1

## 2015-05-01 MED ORDER — ZOLPIDEM TARTRATE 5 MG PO TABS
5.0000 mg | ORAL_TABLET | Freq: Once | ORAL | Status: AC
  Administered 2015-05-01: 5 mg via ORAL
  Filled 2015-05-01: qty 1

## 2015-05-01 MED ORDER — LORAZEPAM 2 MG/ML IJ SOLN
0.5000 mg | Freq: Once | INTRAMUSCULAR | Status: AC
  Administered 2015-05-01: 0.5 mg via INTRAVENOUS
  Filled 2015-05-01: qty 1

## 2015-05-01 MED ORDER — ONDANSETRON HCL 4 MG/2ML IJ SOLN
4.0000 mg | Freq: Once | INTRAMUSCULAR | Status: AC
  Administered 2015-05-01: 4 mg via INTRAVENOUS

## 2015-05-01 MED ORDER — LORAZEPAM 2 MG/ML IJ SOLN
0.5000 mg | Freq: Four times a day (QID) | INTRAMUSCULAR | Status: DC | PRN
  Administered 2015-05-02: 0.5 mg via INTRAVENOUS
  Filled 2015-05-01: qty 1

## 2015-05-01 NOTE — Progress Notes (Addendum)
[facility] PROGRESS NOTE  Stacey Russell ZOX:[id number] DOB: 1995/11/30 DOA: 04/30/2015 PCP: Pcp Not In System  Assessment/Plan: Active Problems:   Intractable vomiting with nausea - Will continue supportive therapy - Will increase protonix to bid - added reglan for intractable nausea Addendum: Patient has had work ups including CT scan of abdomen and pelvis and U/S of abdomen which have been none revealing as cause. Reported cervicitis was not seen on exam by ER personnel.  Code Status: full Family Communication: none at bedside  Disposition Plan: pending improvement in oral intake   Consultants:  none  Procedures:  none  Antibiotics:  none   HPI/Subjective: Patient still complaining of nausea  Objective: Filed Vitals:   05/01/15 1347  BP: 182/98  Pulse: 75  Temp: 97.3 F (36.3 C)  Resp: 20    Intake/Output Summary (Last 24 hours) at 05/01/15 1408 Last data filed at 05/01/15 1300  Gross per 24 hour  Intake 2386.25 ml  Output      3 ml  Net 2383.25 ml   Filed Weights   04/30/15 1156  Weight: 72.576 kg (160 lb)    Exam:   General:  Patient in no acute distress, alert and awake  Cardiovascular: Regular rate and rhythm, no murmurs or rubs  Respiratory: Clear to auscultation bilaterally, no wheezes  Abdomen: Soft, nondistended, nontender  Musculoskeletal: No cyanosis or clubbing   Data Reviewed: Basic Metabolic Panel:  Recent Labs Lab 04/25/15 1929 04/29/15 1518 04/30/15 1250 04/30/15 1720 05/01/15 0400  NA 136 140 136  --  139  K 3.7 3.3* 3.0*  --  3.9  CL 101 102 101  --  106  CO2 23 22 19*  --  26  GLUCOSE 131* 135* 131*  --  108*  BUN --  6  CREATININE 0.62 0.88 0.84 0.76 0.70  CALCIUM 9.7 9.8 9.7  --  8.9  MG  --   --   --  1.7  --   PHOS  --   --   --  3.2  --    Liver Function Tests:  Recent Labs Lab 04/25/15 1929 04/29/15 1518 04/30/15 1250  AST ALT ALKPHOS 72 66 68  BILITOT 0.5  0.8 1.2  PROT 8.5* 8.3* 8.5*  ALBUMIN 5.1* 5.2* 5.2*    Recent Labs Lab 04/29/15 1518 04/30/15 1250  LIPASE 23 31   No results for input(s): AMMONIA in the last 168 hours. CBC:  Recent Labs Lab 04/25/15 1841 04/25/15 1929 04/29/15 1518 04/30/15 1250 04/30/15 1720 05/01/15 0400  WBC 8.0 19.0* 13.8* 18.4* 14.7* 11.5*  NEUTROABS 5.6 17.6* 12.3* 15.8*  --   --   HGB 12.5 14.2 15.5* 15.4* 12.9 12.8  HCT 39.8 41.0 44.6 44.3 38.4 37.8  MCV 81.7 84.7 84.8 83.0 84.8 86.5  PLT 227 344 411* 380 320 317   Cardiac Enzymes: No results for input(s): CKTOTAL, CKMB, CKMBINDEX, TROPONINI in the last 168 hours. BNP (last 3 results) No results for input(s): BNP in the last 8760 hours.  ProBNP (last 3 results) No results for input(s): PROBNP in the last 8760 hours.  CBG: No results for input(s): GLUCAP in the last 168 hours.  Recent Results (from the past 240 hour(s))  Wet prep, genital     Status: Abnormal   Collection Time: 04/29/15  8:54 PM  Result Value Ref Range Status   Yeast Wet Prep HPF POC NONE SEEN NONE SEEN  Final   Trich, Wet Prep NONE SEEN NONE SEEN Final   Clue Cells Wet Prep HPF POC FEW (A) NONE SEEN Final   WBC, Wet Prep HPF POC FEW (A) NONE SEEN Final     Studies: [place] Abdomen Limited  04/29/2015   CLINICAL DATA:  Four day history of upper abdominal pain and vomiting  EXAM: US ABDOMEN LIMITED - RIGHT UPPER QUADRANT  COMPARISON:  CT abdomen and pelvis April 25, 2015  FINDINGS: Gallbladder:  Within the gallbladder, there is a 3 mm echogenic focus which neither moves nor shadows consistent with a small polyp. There are no echogenic foci in the gallbladder which move and shadow as would be expected with gallstones. There is no gallbladder wall thickening or pericholecystic fluid. No sonographic Murphy sign noted.  Common bile duct:  Diameter: 3 mm. There is no intrahepatic or extrahepatic biliary duct dilatation.  Liver:  No focal lesion identified. Within normal limits in  parenchymal echogenicity.  IMPRESSION: 3 mm polyp in the gallbladder. Gallbladder otherwise appears unremarkable. This finding within the gallbladder may warrant a followup study in approximately 1 year to assess for stability. Study otherwise unremarkable.   Electronically Signed   By: Bretta BangWilliam  Woodruff III M.D.   On: 04/29/2015 20:19    Scheduled Meds: . antiseptic oral rinse  7 mL Mouth Rinse BID  . heparin  5,000 Units Subcutaneous 3 times per day  . pantoprazole  40 mg Oral Daily  . sodium chloride  3 mL Intravenous Q12H   Continuous Infusions: . dextrose 5 % and 0.45 % NaCl with KCl 20 mEq/L 75 mL/hr at 05/01/15 0538    Time spent: > 20 minutes    Penny PiaVEGA, Rama Sorci  [facility] Pager [phone] If 7PM-7AM, please contact night-coverage at [email or web address], password [email or web address] 05/01/2015, 2:08 PM

## 2015-05-01 NOTE — Care Management Note (Signed)
Case Management Note  Patient Details  Name: Earney HamburgMorgan Potier MRN: [id number] Date of Birth: 06/08/1996  Subjective/Objective:    19 y/o f admitted w/Intractable n/v. From home.                Action/Plan:Monitor progress for d/c needs.No anticipated d/c needs.   Expected Discharge Date:   (unknown)               Expected Discharge Plan:  Home/Self Care  In-House Referral:     Discharge planning Services  CM Consult  Post Acute Care Choice:    Choice offered to:     DME Arranged:    DME Agency:     HH Arranged:    HH Agency:     Status of Service:  In process, will continue to follow  Medicare Important Message Given:    Date Medicare IM Given:    Medicare IM give by:    Date Additional Medicare IM Given:    Additional Medicare Important Message give by:     If discussed at Long Length of Stay Meetings, dates discussed:    Additional Comments:  Lanier ClamMahabir, Naw Lasala, RN 05/01/2015, 3:44 PM

## 2015-05-01 NOTE — Progress Notes (Signed)
Initial Nutrition Assessment  DOCUMENTATION CODES:  Not applicable  INTERVENTION: - Continue CLD and advance as medically feasible - RD will continue to monitor for needs  NUTRITION DIAGNOSIS:  Inadequate oral intake related to acute illness, nausea, vomiting as evidenced by per patient/family report, meal completion < 25%.  GOAL:  Patient will meet greater than or equal to 90% of their needs  MONITOR:  PO intake, Weight trends, Labs  REASON FOR ASSESSMENT:  Malnutrition Screening Tool  ASSESSMENT: 19 y.o. female with recent history of nausea and emesis presents for the third time to the hospital secondary to above. She states that the problems has been present for the last week or so. Nothing she is aware of makes it better. The problem has progressively gotten worse and as a result patient has not been able to eat or keep her oral medications down.   Pt seen for MST. BMI indicates overweight status. Pt reports she was able to sip on gingerale this AM with no issue. She reports nausea/vomiting began Friday (6/24) and has been getting worse. She states that nothing seemed to "set this off." She indicates that she recently had a stomach virus that resolved on its own. She states otherwise good appetite. Pt indicates 5-8 lb weight loss PTA, which she states was intentional.   Not able to meet needs on CLD. No muscle or fat wasting noted. Medications reviewed. Labs reviewed.  Height:  Ht Readings from Last 1 Encounters:  04/30/15 5\' 5"  (1.651 m)    Weight:  Wt Readings from Last 1 Encounters:  04/30/15 160 lb (72.576 kg)    Ideal Body Weight:  56.8 kg (kg)  Wt Readings from Last 10 Encounters:  04/30/15 160 lb (72.576 kg)    BMI:  Body mass index is 26.63 kg/(m^2).  Estimated Nutritional Needs:  Kcal:  1800-2000  Protein:  70-80 grams  Fluid:  2.2 L/day  Skin:  Reviewed, no issues  Diet Order:  Diet clear liquid Room service appropriate?: Yes; Fluid  consistency:: Thin  EDUCATION NEEDS:  No education needs identified at this time   Intake/Output Summary (Last 24 hours) at 05/01/15 0928 Last data filed at 05/01/15 0700  Gross per 24 hour  Intake 2026.25 ml  Output      3 ml  Net 2023.25 ml    Last BM:  PTA    Trenton GammonJessica Paighton Godette, RD, LDN Inpatient Clinical Dietitian Pager # [phone] After hours/weekend pager # [phone]

## 2015-05-01 NOTE — Progress Notes (Signed)
Pt's HR sustaining SB 47-53. Pt sleeping during this time. Pt asymptomatic. BP 124/74. MD made aware. Will continue to monitor.

## 2015-05-02 ENCOUNTER — Encounter (HOSPITAL_COMMUNITY): Payer: Self-pay | Admitting: Internal Medicine

## 2015-05-02 DIAGNOSIS — F411 Generalized anxiety disorder: Secondary | ICD-10-CM

## 2015-05-02 DIAGNOSIS — IMO0002 Reserved for concepts with insufficient information to code with codable children: Secondary | ICD-10-CM

## 2015-05-02 DIAGNOSIS — R1013 Epigastric pain: Secondary | ICD-10-CM | POA: Diagnosis not present

## 2015-05-02 DIAGNOSIS — F419 Anxiety disorder, unspecified: Secondary | ICD-10-CM | POA: Diagnosis present

## 2015-05-02 DIAGNOSIS — Z62810 Personal history of physical and sexual abuse in childhood: Secondary | ICD-10-CM

## 2015-05-02 DIAGNOSIS — R1110 Vomiting, unspecified: Secondary | ICD-10-CM | POA: Diagnosis not present

## 2015-05-02 DIAGNOSIS — F4001 Agoraphobia with panic disorder: Secondary | ICD-10-CM | POA: Diagnosis not present

## 2015-05-02 DIAGNOSIS — Z609 Problem related to social environment, unspecified: Secondary | ICD-10-CM | POA: Diagnosis not present

## 2015-05-02 DIAGNOSIS — K824 Cholesterolosis of gallbladder: Secondary | ICD-10-CM

## 2015-05-02 DIAGNOSIS — F919 Conduct disorder, unspecified: Secondary | ICD-10-CM | POA: Diagnosis not present

## 2015-05-02 DIAGNOSIS — E876 Hypokalemia: Secondary | ICD-10-CM | POA: Diagnosis not present

## 2015-05-02 DIAGNOSIS — K449 Diaphragmatic hernia without obstruction or gangrene: Secondary | ICD-10-CM | POA: Diagnosis not present

## 2015-05-02 HISTORY — DX: Cholesterolosis of gallbladder: K82.4

## 2015-05-02 LAB — BASIC METABOLIC PANEL
ANION GAP: 10 (ref 5–15)
CO2: 27 mmol/L (ref 22–32)
Calcium: 9.7 mg/dL (ref 8.9–10.3)
Chloride: 100 mmol/L — ABNORMAL LOW (ref 101–111)
Creatinine, Ser: 0.92 mg/dL (ref 0.44–1.00)
GFR calc Af Amer: >60 mL/min (ref >60)
GFR calc non Af Amer: >60 mL/min (ref >60)
GLUCOSE: 121 mg/dL — AB (ref 65–99)
POTASSIUM: 3.8 mmol/L (ref 3.5–5.1)
Sodium: 137 mmol/L (ref 135–145)

## 2015-05-02 LAB — MAGNESIUM: MAGNESIUM: 2 mg/dL (ref 1.7–2.4)

## 2015-05-02 MED ORDER — CEFTRIAXONE SODIUM IN DEXTROSE 20 MG/ML IV SOLN
1.0000 g | Freq: Once | INTRAVENOUS | Status: AC
  Administered 2015-05-02: 1 g via INTRAVENOUS
  Filled 2015-05-02: qty 50

## 2015-05-02 MED ORDER — DEXTROSE 5 % IV SOLN
500.0000 mg | INTRAVENOUS | Status: AC
  Administered 2015-05-02 – 2015-05-03 (×2): 500 mg via INTRAVENOUS
  Filled 2015-05-02 (×2): qty 500

## 2015-05-02 MED ORDER — ZOLPIDEM TARTRATE 5 MG PO TABS: 5.0000 mg | ORAL_TABLET | Freq: Once | ORAL | Status: DC

## 2015-05-02 MED ORDER — ZOLPIDEM TARTRATE 5 MG PO TABS
5.0000 mg | ORAL_TABLET | Freq: Every evening | ORAL | Status: DC | PRN
  Administered 2015-05-02: 5 mg via ORAL
  Filled 2015-05-02: qty 1

## 2015-05-02 MED ORDER — ENOXAPARIN SODIUM 40 MG/0.4ML ~~LOC~~ SOLN
40.0000 mg | SUBCUTANEOUS | Status: DC
  Administered 2015-05-03: 40 mg via SUBCUTANEOUS
  Filled 2015-05-02 (×3): qty 0.4

## 2015-05-02 MED ORDER — LORAZEPAM 2 MG/ML IJ SOLN
0.5000 mg | INTRAMUSCULAR | Status: DC | PRN
  Administered 2015-05-02 (×3): 0.5 mg via INTRAVENOUS
  Filled 2015-05-02 (×3): qty 1

## 2015-05-02 MED ORDER — KETOROLAC TROMETHAMINE 30 MG/ML IJ SOLN
30.0000 mg | Freq: Once | INTRAMUSCULAR | Status: AC
  Administered 2015-05-02: 30 mg via INTRAVENOUS
  Filled 2015-05-02: qty 1

## 2015-05-02 NOTE — Progress Notes (Signed)
Writer rounded on pt in room when writer opened the door she was over the sink with her left pointer finger in her mouth trying to vomit. Clinical research associatewriter asked her " what are you doing" she replied " I'm just throwing up no big deal, don't worry about it."  Writer said "ok" close the door and informed the RN [place].
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
## 2015-05-03 DIAGNOSIS — F410 Panic disorder [episodic paroxysmal anxiety] without agoraphobia: Secondary | ICD-10-CM | POA: Diagnosis present

## 2015-05-03 DIAGNOSIS — F4001 Agoraphobia with panic disorder: Secondary | ICD-10-CM | POA: Diagnosis not present

## 2015-05-03 DIAGNOSIS — K824 Cholesterolosis of gallbladder: Secondary | ICD-10-CM | POA: Diagnosis present

## 2015-05-03 DIAGNOSIS — D72829 Elevated white blood cell count, unspecified: Secondary | ICD-10-CM | POA: Diagnosis present

## 2015-05-03 DIAGNOSIS — R112 Nausea with vomiting, unspecified: Secondary | ICD-10-CM | POA: Diagnosis present

## 2015-05-03 DIAGNOSIS — F329 Major depressive disorder, single episode, unspecified: Secondary | ICD-10-CM

## 2015-05-03 DIAGNOSIS — Z609 Problem related to social environment, unspecified: Secondary | ICD-10-CM | POA: Diagnosis not present

## 2015-05-03 DIAGNOSIS — Z659 Problem related to unspecified psychosocial circumstances: Secondary | ICD-10-CM

## 2015-05-03 DIAGNOSIS — F919 Conduct disorder, unspecified: Secondary | ICD-10-CM | POA: Diagnosis present

## 2015-05-03 DIAGNOSIS — F419 Anxiety disorder, unspecified: Secondary | ICD-10-CM

## 2015-05-03 DIAGNOSIS — K449 Diaphragmatic hernia without obstruction or gangrene: Secondary | ICD-10-CM | POA: Diagnosis present

## 2015-05-03 DIAGNOSIS — Z91410 Personal history of adult physical and sexual abuse: Secondary | ICD-10-CM | POA: Diagnosis not present

## 2015-05-03 DIAGNOSIS — Z62810 Personal history of physical and sexual abuse in childhood: Secondary | ICD-10-CM | POA: Diagnosis not present

## 2015-05-03 DIAGNOSIS — Z79891 Long term (current) use of opiate analgesic: Secondary | ICD-10-CM | POA: Diagnosis not present

## 2015-05-03 DIAGNOSIS — F450 Somatization disorder: Secondary | ICD-10-CM | POA: Diagnosis present

## 2015-05-03 DIAGNOSIS — F4000 Agoraphobia, unspecified: Secondary | ICD-10-CM | POA: Diagnosis present

## 2015-05-03 DIAGNOSIS — N72 Inflammatory disease of cervix uteri: Secondary | ICD-10-CM | POA: Diagnosis present

## 2015-05-03 DIAGNOSIS — R1013 Epigastric pain: Secondary | ICD-10-CM | POA: Diagnosis present

## 2015-05-03 DIAGNOSIS — R1110 Vomiting, unspecified: Secondary | ICD-10-CM | POA: Diagnosis not present

## 2015-05-03 DIAGNOSIS — E876 Hypokalemia: Secondary | ICD-10-CM | POA: Diagnosis present

## 2015-05-03 DIAGNOSIS — F411 Generalized anxiety disorder: Secondary | ICD-10-CM | POA: Diagnosis not present

## 2015-05-03 DIAGNOSIS — Z79899 Other long term (current) drug therapy: Secondary | ICD-10-CM | POA: Diagnosis not present

## 2015-05-03 DIAGNOSIS — F413 Other mixed anxiety disorders: Secondary | ICD-10-CM | POA: Diagnosis not present

## 2015-05-03 DIAGNOSIS — F439 Reaction to severe stress, unspecified: Secondary | ICD-10-CM | POA: Diagnosis present

## 2015-05-03 MED ORDER — ACETAMINOPHEN 325 MG PO TABS: 650.0000 mg | ORAL_TABLET | ORAL | Status: DC | PRN

## 2015-05-03 MED ORDER — CITALOPRAM HYDROBROMIDE 20 MG PO TABS
20.0000 mg | ORAL_TABLET | Freq: Every day | ORAL | Status: DC
  Administered 2015-05-03 – 2015-05-04 (×2): 20 mg via ORAL
  Filled 2015-05-03 (×3): qty 1

## 2015-05-03 MED ORDER — KETOROLAC TROMETHAMINE 30 MG/ML IJ SOLN: 30.0000 mg | Freq: Four times a day (QID) | INTRAMUSCULAR | Status: DC | PRN

## 2015-05-03 MED ORDER — CLONAZEPAM 0.5 MG PO TABS
0.5000 mg | ORAL_TABLET | Freq: Three times a day (TID) | ORAL | Status: DC | PRN
  Administered 2015-05-03 – 2015-05-05 (×6): 0.5 mg via ORAL
  Filled 2015-05-03 (×7): qty 1
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
## 2015-05-04 ENCOUNTER — Encounter (HOSPITAL_COMMUNITY): Payer: Self-pay | Admitting: Gastroenterology

## 2015-05-04 ENCOUNTER — Encounter (HOSPITAL_COMMUNITY)
Admission: EM | Disposition: A | Discharge status: Home / Self Care | Payer: Self-pay | Source: Home / Self Care | Attending: Internal Medicine

## 2015-05-04 DIAGNOSIS — F4001 Agoraphobia with panic disorder: Secondary | ICD-10-CM

## 2015-05-04 DIAGNOSIS — K449 Diaphragmatic hernia without obstruction or gangrene: Secondary | ICD-10-CM

## 2015-05-04 HISTORY — PX: ESOPHAGOGASTRODUODENOSCOPY: SHX5428

## 2015-05-04 SURGERY — EGD (ESOPHAGOGASTRODUODENOSCOPY)
Anesthesia: Moderate Sedation

## 2015-05-04 MED ORDER — MIDAZOLAM HCL 10 MG/2ML IJ SOLN
INTRAMUSCULAR | Status: DC | PRN
  Administered 2015-05-04: 2 mg via INTRAVENOUS
  Administered 2015-05-04: 1 mg via INTRAVENOUS

## 2015-05-04 MED ORDER — FENTANYL CITRATE (PF) 100 MCG/2ML IJ SOLN
INTRAMUSCULAR | Status: AC
Start: 2015-05-04 — End: 2015-05-04
  Filled 2015-05-04: qty 4

## 2015-05-04 MED ORDER — MIDAZOLAM HCL 5 MG/ML IJ SOLN
INTRAMUSCULAR | Status: AC
  Filled 2015-05-04: qty 3

## 2015-05-04 MED ORDER — PANTOPRAZOLE SODIUM 40 MG IV SOLR
40.0000 mg | Freq: Every day | INTRAVENOUS | Status: DC
  Administered 2015-05-04 – 2015-05-06 (×3): 40 mg via INTRAVENOUS
  Filled 2015-05-04 (×3): qty 40

## 2015-05-04 MED ORDER — FENTANYL CITRATE (PF) 100 MCG/2ML IJ SOLN
INTRAMUSCULAR | Status: DC | PRN
  Administered 2015-05-04: 25 ug via INTRAVENOUS

## 2015-05-04 MED ORDER — DIPHENHYDRAMINE HCL 50 MG/ML IJ SOLN
INTRAMUSCULAR | Status: AC
  Filled 2015-05-04: qty 1

## 2015-05-04 MED ORDER — PROMETHAZINE HCL 25 MG/ML IJ SOLN
25.0000 mg | Freq: Once | INTRAMUSCULAR | Status: AC
  Administered 2015-05-04: 25 mg via INTRAVENOUS
  Filled 2015-05-04: qty 1

## 2015-05-04 MED ORDER — LORAZEPAM 2 MG/ML IJ SOLN
1.0000 mg | Freq: Once | INTRAMUSCULAR | Status: AC
  Administered 2015-05-04: 1 mg via INTRAVENOUS
  Filled 2015-05-04: qty 1

## 2015-05-04 MED ORDER — CLONIDINE HCL 0.1 MG PO TABS
0.1000 mg | ORAL_TABLET | Freq: Once | ORAL | Status: AC
  Administered 2015-05-04: 0.1 mg via ORAL
  Filled 2015-05-04: qty 1

## 2015-05-04 MED ORDER — SODIUM CHLORIDE 0.9 % IV SOLN: INTRAVENOUS | Status: DC
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
## 2015-05-04 NOTE — Clinical Social Work Note (Signed)
CSW met with pt at bedside to present the outpatient services that were discussed with pt yesterday  CSW provided written information for pt regarding intensive outpatient services offered at [facility].  CSW encouraged pt to discuss thoughts and feelings related to this service and once again pt stated that she would not be able to attend this intensive serrvce as it is offered from 1 - 4 during the weekdays and pt does not want to loose her job stating she "enjoys it"and they are supportive of her  CSW also presented numerous outpatient mental health therapists that accept pt's insurance and she can access these Monday through Friday by calling the numbers provided  Pt stated that she would follow up with an outpatient therapist once released from the hospital.  CSW encouraged pt to call CSW with any questions she had today  .Dede Query, LCSW [facility] Clinical Social Worker - Weekend Coverage cell #: [phone]
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
## 2015-05-04 NOTE — Op Note (Signed)
[facility] [address] [place], [id number]   ENDOSCOPY PROCEDURE REPORT  PATIENT: [name], Stacey  MR#: [id number] BIRTHDATE: 03/15/1996 , 18  yrs. old GENDER: female ENDOSCOPIST: Vida RiggerMarc Mariacristina Aday, MD REFERRED BY: PROCEDURE DATE:  05/04/2015 PROCEDURE:  EGD, diagnostic ASA CLASS:     Class I INDICATIONS:  nausea, vomiting, and epigastric pain. MEDICATIONS: Fentanyl 25 mcg IV and Versed 3 mg IV TOPICAL ANESTHETIC: Cetacaine Spray  DESCRIPTION OF PROCEDURE: After the risks benefits and alternatives of the procedure were thoroughly explained, informed consent was obtained.  The Pentax Gastroscope [id number] endoscope was introduced through the mouth and advanced to the second portion of the duodenum , Without limitations.  The instrument was slowly withdrawn as the mucosa was fully examined. Estimated blood loss is zero unless otherwise noted in this procedure report.    the findings are recorded below       Retroflexed views revealed a hiatal hernia.     The scope was then withdrawn from the patient and the procedure completed.  COMPLICATIONS: There were no immediate complications.  ENDOSCOPIC IMPRESSION: small hiatal hernia otherwise within normal limits EGD  RECOMMENDATIONS: continue psych management use pump inhibitors slowly advance diet as tolerated happy to see back in the office when necessary please call me if I could be of any further assistance this hospital stay  REPEAT EXAM: as needed  eSigned:  Vida RiggerMarc Athalia Setterlund, MD 05/04/2015 11:00 AM    CC:  CPT CODES: ICD CODES:  The ICD and CPT codes recommended by this software are interpretations from the data that the clinical staff has captured with the software.  The verification of the translation of this report to the ICD and CPT codes and modifiers is the sole responsibility of the health care institution and practicing physician where this report was generated.  [employer] will  not be held responsible for the validity of the ICD and CPT codes included on this report.  AMA assumes no liability for data contained or not contained herein. CPT is a Publishing rights managerregistered trademark of the [employer].  PATIENT NAME:  [name], Stacey MR#: [id number]

## 2015-05-05 ENCOUNTER — Inpatient Hospital Stay (HOSPITAL_COMMUNITY): Payer: BLUE CROSS/BLUE SHIELD

## 2015-05-05 DIAGNOSIS — IMO0002 Reserved for concepts with insufficient information to code with codable children: Secondary | ICD-10-CM | POA: Diagnosis present

## 2015-05-05 DIAGNOSIS — F919 Conduct disorder, unspecified: Secondary | ICD-10-CM

## 2015-05-05 DIAGNOSIS — F410 Panic disorder [episodic paroxysmal anxiety] without agoraphobia: Secondary | ICD-10-CM | POA: Diagnosis present

## 2015-05-05 DIAGNOSIS — N72 Inflammatory disease of cervix uteri: Secondary | ICD-10-CM | POA: Diagnosis present

## 2015-05-05 DIAGNOSIS — K449 Diaphragmatic hernia without obstruction or gangrene: Secondary | ICD-10-CM

## 2015-05-05 MED ORDER — PROMETHAZINE HCL 25 MG/ML IJ SOLN
25.0000 mg | INTRAMUSCULAR | Status: DC | PRN
  Administered 2015-05-05 – 2015-05-06 (×3): 25 mg via INTRAVENOUS
  Filled 2015-05-05 (×3): qty 1

## 2015-05-05 MED ORDER — METOCLOPRAMIDE HCL 5 MG/ML IJ SOLN
5.0000 mg | Freq: Four times a day (QID) | INTRAMUSCULAR | Status: DC
  Administered 2015-05-05 – 2015-05-06 (×4): 5 mg via INTRAVENOUS
  Filled 2015-05-05 (×2): qty 2
  Filled 2015-05-05 (×4): qty 1
  Filled 2015-05-05 (×2): qty 2

## 2015-05-05 MED ORDER — LORAZEPAM 2 MG/ML IJ SOLN
1.0000 mg | INTRAMUSCULAR | Status: DC | PRN
  Administered 2015-05-05: 1 mg via INTRAVENOUS
  Filled 2015-05-05: qty 1

## 2015-05-05 MED ORDER — CLONAZEPAM 1 MG PO TABS
1.0000 mg | ORAL_TABLET | Freq: Two times a day (BID) | ORAL | Status: DC
  Administered 2015-05-05 – 2015-05-06 (×2): 1 mg via ORAL
  Filled 2015-05-05 (×2): qty 1

## 2015-05-05 MED ORDER — GI COCKTAIL ~~LOC~~
30.0000 mL | Freq: Three times a day (TID) | ORAL | Status: DC | PRN
  Administered 2015-05-05 (×3): 30 mL via ORAL
  Filled 2015-05-05 (×5): qty 30

## 2015-05-05 MED ORDER — SERTRALINE HCL 50 MG PO TABS
50.0000 mg | ORAL_TABLET | Freq: Every day | ORAL | Status: DC
  Administered 2015-05-05 – 2015-05-06 (×2): 50 mg via ORAL
  Filled 2015-05-05 (×2): qty 1
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
## 2015-05-05 NOTE — Progress Notes (Signed)
Earney HamburgMorgan Shawler 3:13 PM  Subjective: Patient without any new complaints and no problems from her endoscopy and the results were discussed and we answered all of her questions about hiatal hernia and reflux and foods that can make it worse and over-the-counter medicines used to treat  Objective: Vital signs stable afebrile no acute distress patient not examined today looks better than yesterday no new labs or x-rays  Assessment: Probably stress induced nausea and vomiting in patient with small hiatal hernia  Plan: Please let us know if we can help any further with this hospital stay otherwise happy see back in the office as needed  [facility],Arielys Wandersee E  Pager [phone] After 5PM or if no answer call [phone]
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
## 2015-05-06 ENCOUNTER — Encounter (HOSPITAL_COMMUNITY): Payer: Self-pay | Admitting: Gastroenterology

## 2015-05-06 DIAGNOSIS — R1110 Vomiting, unspecified: Secondary | ICD-10-CM

## 2015-05-06 DIAGNOSIS — E876 Hypokalemia: Secondary | ICD-10-CM

## 2015-05-06 DIAGNOSIS — R1013 Epigastric pain: Secondary | ICD-10-CM

## 2015-05-06 DIAGNOSIS — F413 Other mixed anxiety disorders: Secondary | ICD-10-CM

## 2015-05-06 MED ORDER — SERTRALINE HCL 50 MG PO TABS: 50.0000 mg | ORAL_TABLET | Freq: Every day | ORAL | Status: DC

## 2015-05-06 MED ORDER — OMEPRAZOLE 20 MG PO CPDR: 20.0000 mg | DELAYED_RELEASE_CAPSULE | Freq: Every day | ORAL | Status: DC

## 2015-05-06 MED ORDER — PROMETHAZINE HCL 25 MG PO TABS: 25.0000 mg | ORAL_TABLET | Freq: Four times a day (QID) | ORAL | Status: DC | PRN

## 2015-05-06 MED ORDER — CLONAZEPAM 1 MG PO TABS: 1.0000 mg | ORAL_TABLET | Freq: Two times a day (BID) | ORAL | Status: DC
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
## 2015-05-06 NOTE — Progress Notes (Signed)
D/C instructions reviewed with patient and patients mother.  Pt verbalized understanding, pt refused wheelchair and wanted to walk out with mother. Patient d/c home. Keyla Milone A

## 2015-06-02 ENCOUNTER — Encounter: Payer: Self-pay | Admitting: Internal Medicine

## 2015-06-02 NOTE — Telephone Encounter (Signed)
This encounter was created in error - please disregard.

## 2015-06-02 NOTE — Progress Notes (Addendum)
Pt with 2 [facility] ED visits in last 6 months  Pt called [facility] ED to request return for medication refill for zoloft Pt referred to CM by ED unit secretary [facility] ED CM spoke with pt on how to obtain an in network pcp with her bc[employer] insurance coverage via the customer service number or web site  Cm reviewed ED level of care for crisis/emergent services and community pcp level of care to manage continuous or chronic medical concerns.  The pt voiced understanding CM encouraged pt and discussed pt's responsibility to verify with pt's insurance carrier that any recommended medical provider offered by any emergency room or a hospital provider is within the carrier's network. The pt voiced understanding

## 2016-07-16 IMAGING — CT CT ABD-PELV W/ CM
2 of 4 series · 16 of 46 positions shown, 18 images · IV contrast (OMNIPAQUE 300)
Comparison: None.

CLINICAL DATA: Pain with vomiting beginning this morning.
Leukocytosis.

EXAM:
CT ABDOMEN AND PELVIS WITH CONTRAST
TECHNIQUE: Multidetector CT imaging of the abdomen and pelvis was performed
using the standard protocol following bolus administration of
intravenous contrast.
CONTRAST:  50mL OMNIPAQUE IOHEXOL 300 MG/ML SOLN, 100mL OMNIPAQUE
IOHEXOL 300 MG/ML SOLN

[Series 2: abd/pel with · axial · 0.72mm/px · z∈[-435,-25]mm · 13 of 90 slices shown, 15 images]
[im 4/90  soft-tissue]
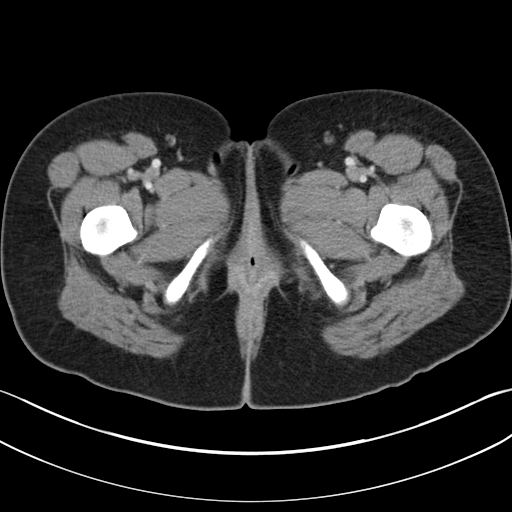
[im 4/90  bone]
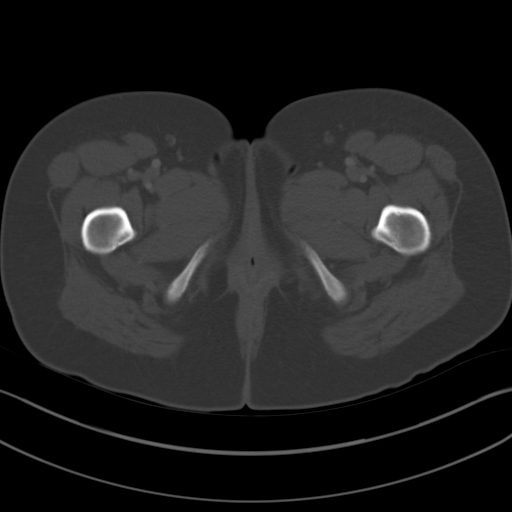
[im 12/90  soft-tissue]
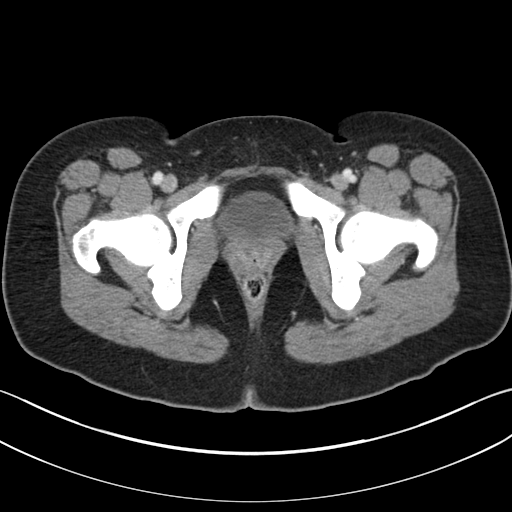
[im 19/90  soft-tissue]
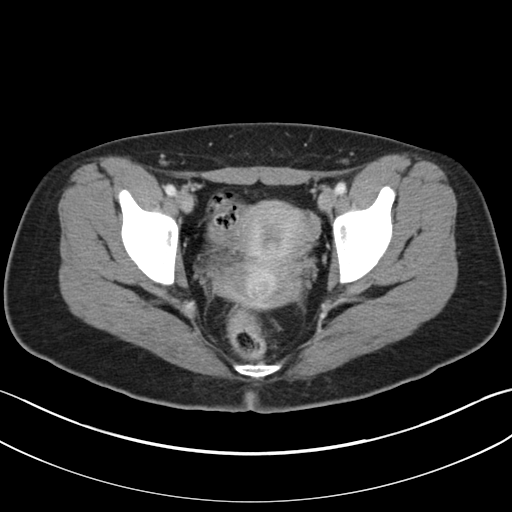
[im 26/90  soft-tissue]
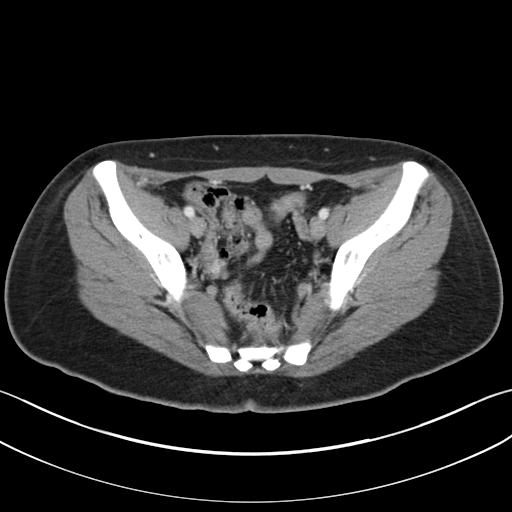
[im 30/90  soft-tissue]
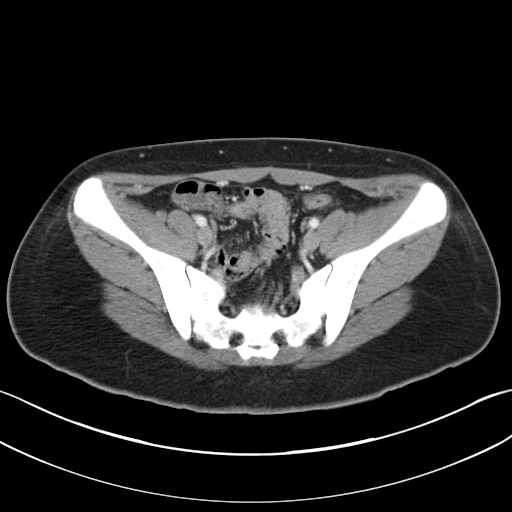
[im 38/90  soft-tissue]
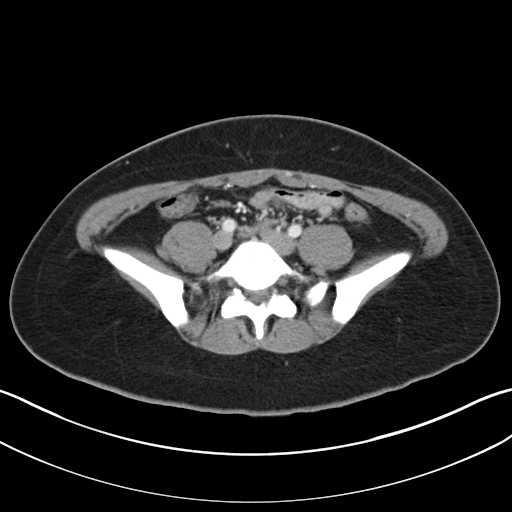
[im 45/90  soft-tissue]
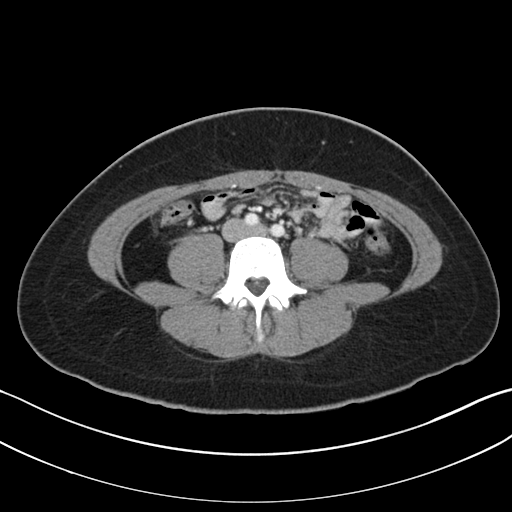
[im 52/90  soft-tissue]
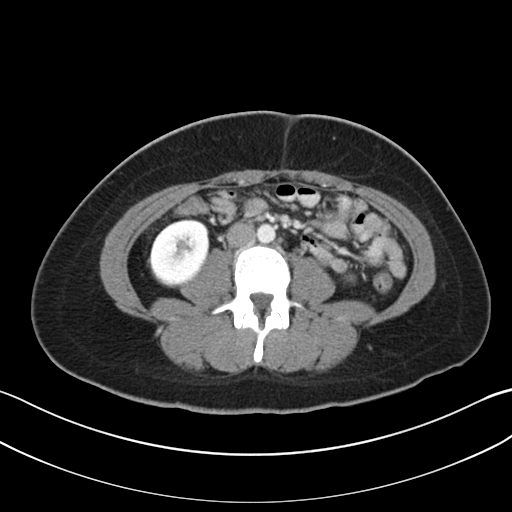
[im 60/90  soft-tissue]
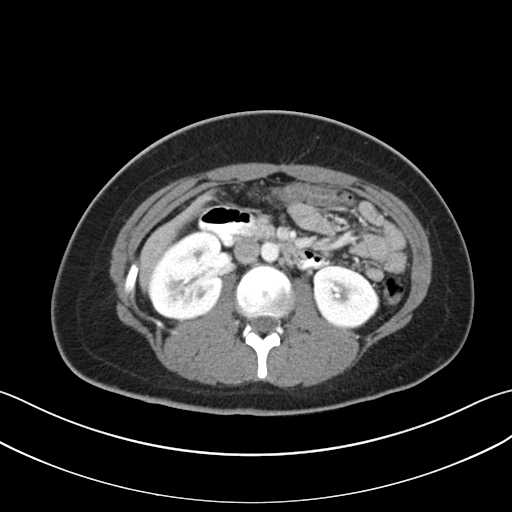
[im 60/90  bone]
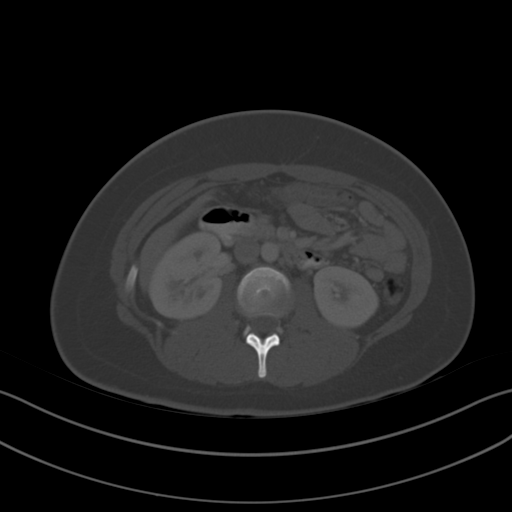
[im 64/90  soft-tissue]
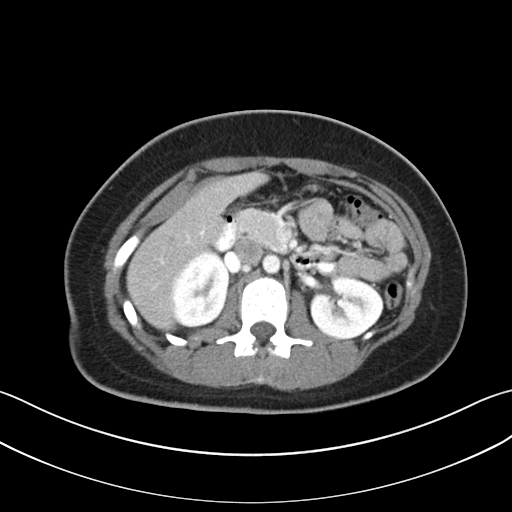
[im 71/90  soft-tissue]
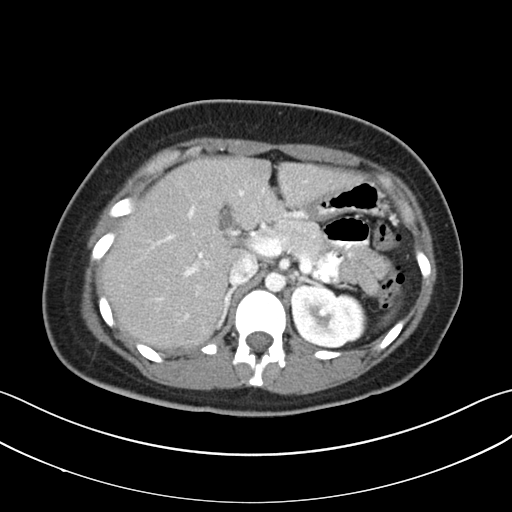
[im 78/90  soft-tissue]
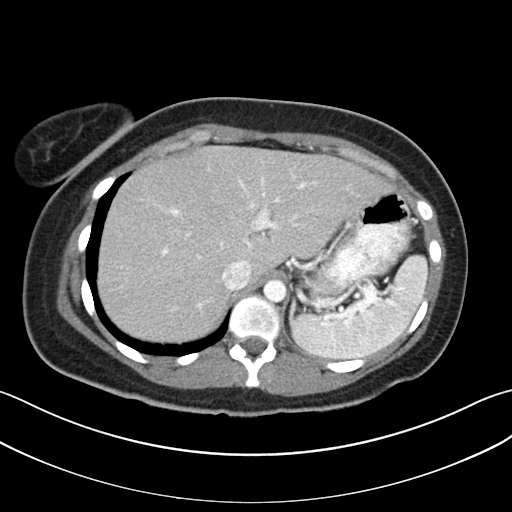
[im 86/90  soft-tissue]
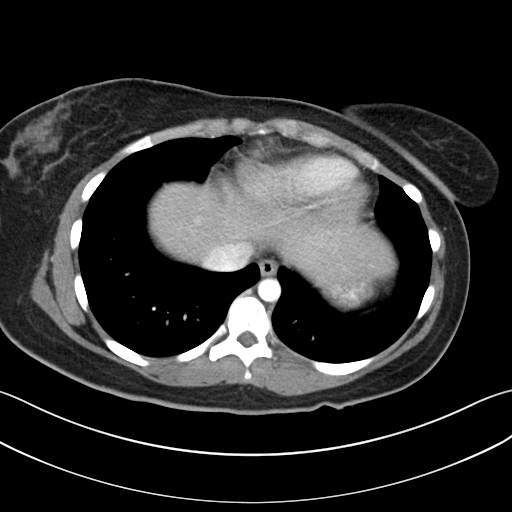

[Series 5: coronal a/|p · coronal · 0.76mm/px · 3 of 78 slices shown]
[im 26/78  soft-tissue]
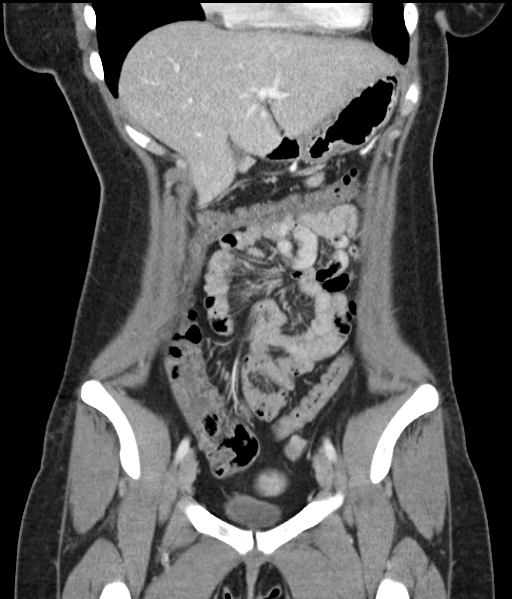
[im 35/78  soft-tissue]
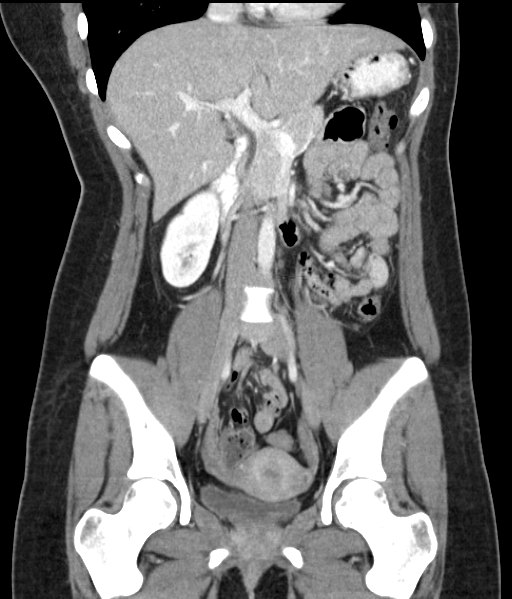
[im 43/78  soft-tissue]
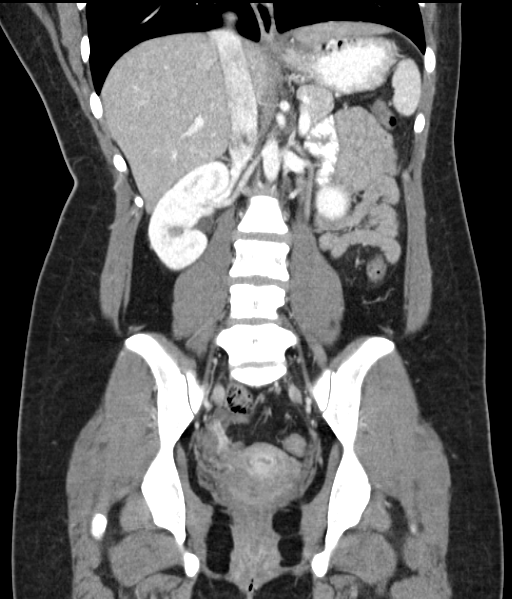

[16 of 46 positions shown; findings below may reference images not displayed]

FINDINGS: LUNG BASES: Included view of the lung bases are clear. Visualized
heart and pericardium are unremarkable.

SOLID ORGANS: The liver, spleen, gallbladder, pancreas and adrenal
glands are unremarkable.

GASTROINTESTINAL TRACT: The stomach, small and large bowel are
normal in course and caliber without inflammatory changes.
Identifiable portions of the appendix appear normal.

KIDNEYS/ URINARY TRACT: Kidneys are orthotopic, demonstrating
symmetric enhancement. No nephrolithiasis, hydronephrosis or solid
renal masses. The unopacified ureters are normal in course and
caliber. Urinary bladder is partially distended and unremarkable.

PERITONEUM/RETROPERITONEUM: Aortoiliac vessels are normal in course
and caliber. No lymphadenopathy by CT size criteria. Thickened
edematous appearing cervix. Small area presumed blood products
within the lower uterine segment. Small amount of free fluid in the
pelvis without abscess nor intraperitoneal free air.

SOFT TISSUE/OSSEOUS STRUCTURES: Non-suspicious. Subcentimeter
probable bone island RIGHT sacrum.
IMPRESSION: Findings concerning for cervicitis.

No CT evidence of acute appendicitis.

## 2016-07-24 ENCOUNTER — Inpatient Hospital Stay (HOSPITAL_COMMUNITY)
Admission: EM | Admit: 2016-07-24 | Discharge: 2016-07-29 | DRG: 392 | Disposition: A | Discharge status: Home / Self Care | Payer: Self-pay | Attending: Internal Medicine | Admitting: Internal Medicine

## 2016-07-24 ENCOUNTER — Encounter (HOSPITAL_COMMUNITY): Payer: Self-pay

## 2016-07-24 DIAGNOSIS — IMO0001 Reserved for inherently not codable concepts without codable children: Secondary | ICD-10-CM | POA: Diagnosis present

## 2016-07-24 DIAGNOSIS — A419 Sepsis, unspecified organism: Secondary | ICD-10-CM

## 2016-07-24 DIAGNOSIS — F4323 Adjustment disorder with mixed anxiety and depressed mood: Secondary | ICD-10-CM

## 2016-07-24 DIAGNOSIS — R030 Elevated blood-pressure reading, without diagnosis of hypertension: Secondary | ICD-10-CM | POA: Diagnosis present

## 2016-07-24 DIAGNOSIS — K824 Cholesterolosis of gallbladder: Secondary | ICD-10-CM | POA: Diagnosis present

## 2016-07-24 DIAGNOSIS — R112 Nausea with vomiting, unspecified: Principal | ICD-10-CM | POA: Diagnosis present

## 2016-07-24 DIAGNOSIS — R6510 Systemic inflammatory response syndrome (SIRS) of non-infectious origin without acute organ dysfunction: Secondary | ICD-10-CM | POA: Diagnosis present

## 2016-07-24 DIAGNOSIS — F329 Major depressive disorder, single episode, unspecified: Secondary | ICD-10-CM | POA: Diagnosis present

## 2016-07-24 DIAGNOSIS — R1110 Vomiting, unspecified: Secondary | ICD-10-CM

## 2016-07-24 DIAGNOSIS — R0602 Shortness of breath: Secondary | ICD-10-CM

## 2016-07-24 DIAGNOSIS — K529 Noninfective gastroenteritis and colitis, unspecified: Secondary | ICD-10-CM | POA: Diagnosis present

## 2016-07-24 DIAGNOSIS — D72829 Elevated white blood cell count, unspecified: Secondary | ICD-10-CM | POA: Diagnosis present

## 2016-07-24 DIAGNOSIS — F4310 Post-traumatic stress disorder, unspecified: Secondary | ICD-10-CM

## 2016-07-24 DIAGNOSIS — E872 Acidosis, unspecified: Secondary | ICD-10-CM | POA: Diagnosis present

## 2016-07-24 DIAGNOSIS — F4312 Post-traumatic stress disorder, chronic: Secondary | ICD-10-CM | POA: Diagnosis present

## 2016-07-24 DIAGNOSIS — E876 Hypokalemia: Secondary | ICD-10-CM | POA: Diagnosis present

## 2016-07-24 DIAGNOSIS — R1013 Epigastric pain: Secondary | ICD-10-CM | POA: Diagnosis present

## 2016-07-24 DIAGNOSIS — F419 Anxiety disorder, unspecified: Secondary | ICD-10-CM | POA: Diagnosis present

## 2016-07-24 DIAGNOSIS — F32A Depression, unspecified: Secondary | ICD-10-CM | POA: Diagnosis present

## 2016-07-24 LAB — URINE MICROSCOPIC-ADD ON

## 2016-07-24 LAB — COMPREHENSIVE METABOLIC PANEL
ALK PHOS: 57 U/L (ref 38–126)
ALT: 26 U/L (ref 14–54)
AST: 22 U/L (ref 15–41)
Albumin: 5.4 g/dL — ABNORMAL HIGH (ref 3.5–5.0)
Anion gap: 15 (ref 5–15)
BILIRUBIN TOTAL: 0.7 mg/dL (ref 0.3–1.2)
BUN: 11 mg/dL (ref 6–20)
CALCIUM: 10.2 mg/dL (ref 8.9–10.3)
CHLORIDE: 107 mmol/L (ref 101–111)
CO2: 17 mmol/L — ABNORMAL LOW (ref 22–32)
CREATININE: 0.65 mg/dL (ref 0.44–1.00)
Glucose, Bld: 158 mg/dL — ABNORMAL HIGH (ref 65–99)
Potassium: 3.6 mmol/L (ref 3.5–5.1)
Sodium: 139 mmol/L (ref 135–145)
TOTAL PROTEIN: 8.7 g/dL — AB (ref 6.5–8.1)

## 2016-07-24 LAB — URINALYSIS, ROUTINE W REFLEX MICROSCOPIC
Bilirubin Urine: NEGATIVE
GLUCOSE, UA: NEGATIVE mg/dL
Nitrite: NEGATIVE
PROTEIN: NEGATIVE mg/dL
Specific Gravity, Urine: 1.029 (ref 1.005–1.030)
pH: 6.5 (ref 5.0–8.0)

## 2016-07-24 LAB — CBC
HCT: 42.5 % (ref 36.0–46.0)
Hemoglobin: 14.9 g/dL (ref 12.0–15.0)
MCH: 30 pg (ref 26.0–34.0)
MCHC: 35.1 g/dL (ref 30.0–36.0)
MCV: 85.7 fL (ref 78.0–100.0)
PLATELETS: 436 10*3/uL — AB (ref 150–400)
RBC: 4.96 MIL/uL (ref 3.87–5.11)
RDW: 12.3 % (ref 11.5–15.5)
WBC: 15.6 10*3/uL — AB (ref 4.0–10.5)

## 2016-07-24 LAB — POC URINE PREG, ED: PREG TEST UR: NEGATIVE

## 2016-07-24 LAB — LIPASE, BLOOD: LIPASE: 20 U/L (ref 11–51)

## 2016-07-24 MED ORDER — SODIUM CHLORIDE 0.9 % IV BOLUS (SEPSIS)
1000.0000 mL | Freq: Once | INTRAVENOUS | Status: AC
  Administered 2016-07-25: 1000 mL via INTRAVENOUS

## 2016-07-24 MED ORDER — SODIUM CHLORIDE 0.9 % IV BOLUS (SEPSIS)
1000.0000 mL | Freq: Once | INTRAVENOUS | Status: AC
  Administered 2016-07-24: 1000 mL via INTRAVENOUS

## 2016-07-24 MED ORDER — ONDANSETRON HCL 4 MG/2ML IJ SOLN
4.0000 mg | Freq: Once | INTRAMUSCULAR | Status: AC
  Administered 2016-07-25: 4 mg via INTRAVENOUS
  Filled 2016-07-24: qty 2

## 2016-07-24 MED ORDER — PROMETHAZINE HCL 25 MG/ML IJ SOLN
25.0000 mg | Freq: Once | INTRAMUSCULAR | Status: AC
  Administered 2016-07-24: 25 mg via INTRAVENOUS
  Filled 2016-07-24: qty 1

## 2016-07-24 MED ORDER — ONDANSETRON HCL 4 MG/2ML IJ SOLN: 4.0000 mg | Freq: Once | INTRAMUSCULAR | Status: DC
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
## 2016-07-24 NOTE — ED Triage Notes (Signed)
Pt presents with c/o vomiting and nausea that started today. Pt also reports that she has been shaking and has chills. Pt reports a migraine for the past couple of days, hx of same but reports that she does not have a migraine at this time. Hypertensive in triage, 176/109, hx of high BP.

## 2016-07-25 ENCOUNTER — Encounter (HOSPITAL_COMMUNITY): Payer: Self-pay | Admitting: Internal Medicine

## 2016-07-25 ENCOUNTER — Observation Stay (HOSPITAL_COMMUNITY): Payer: Self-pay

## 2016-07-25 DIAGNOSIS — IMO0001 Reserved for inherently not codable concepts without codable children: Secondary | ICD-10-CM | POA: Diagnosis present

## 2016-07-25 DIAGNOSIS — F419 Anxiety disorder, unspecified: Secondary | ICD-10-CM

## 2016-07-25 DIAGNOSIS — E872 Acidosis, unspecified: Secondary | ICD-10-CM | POA: Diagnosis present

## 2016-07-25 DIAGNOSIS — R6510 Systemic inflammatory response syndrome (SIRS) of non-infectious origin without acute organ dysfunction: Secondary | ICD-10-CM | POA: Diagnosis present

## 2016-07-25 DIAGNOSIS — F32A Depression, unspecified: Secondary | ICD-10-CM | POA: Diagnosis present

## 2016-07-25 DIAGNOSIS — R112 Nausea with vomiting, unspecified: Secondary | ICD-10-CM | POA: Diagnosis present

## 2016-07-25 DIAGNOSIS — F329 Major depressive disorder, single episode, unspecified: Secondary | ICD-10-CM | POA: Diagnosis present

## 2016-07-25 DIAGNOSIS — R030 Elevated blood-pressure reading, without diagnosis of hypertension: Secondary | ICD-10-CM

## 2016-07-25 DIAGNOSIS — F4323 Adjustment disorder with mixed anxiety and depressed mood: Secondary | ICD-10-CM

## 2016-07-25 DIAGNOSIS — D72829 Elevated white blood cell count, unspecified: Secondary | ICD-10-CM | POA: Diagnosis present

## 2016-07-25 LAB — PROTIME-INR
INR: 1.08
Prothrombin Time: 14 seconds (ref 11.4–15.2)

## 2016-07-25 LAB — CBC
HEMATOCRIT: 37.9 % (ref 36.0–46.0)
HEMOGLOBIN: 13 g/dL (ref 12.0–15.0)
MCH: 29 pg (ref 26.0–34.0)
MCHC: 34.3 g/dL (ref 30.0–36.0)
MCV: 84.4 fL (ref 78.0–100.0)
Platelets: 334 10*3/uL (ref 150–400)
RBC: 4.49 MIL/uL (ref 3.87–5.11)
RDW: 12.2 % (ref 11.5–15.5)
WBC: 16.1 10*3/uL — ABNORMAL HIGH (ref 4.0–10.5)

## 2016-07-25 LAB — BASIC METABOLIC PANEL
ANION GAP: 11 (ref 5–15)
BUN: 8 mg/dL (ref 6–20)
CHLORIDE: 105 mmol/L (ref 101–111)
CO2: 21 mmol/L — AB (ref 22–32)
Calcium: 8.7 mg/dL — ABNORMAL LOW (ref 8.9–10.3)
Creatinine, Ser: 0.61 mg/dL (ref 0.44–1.00)
GFR calc Af Amer: >60 mL/min (ref >60)
GFR calc non Af Amer: >60 mL/min (ref >60)
GLUCOSE: 148 mg/dL — AB (ref 65–99)
POTASSIUM: 3.3 mmol/L — AB (ref 3.5–5.1)
Sodium: 137 mmol/L (ref 135–145)

## 2016-07-25 LAB — LACTIC ACID, PLASMA
LACTIC ACID, VENOUS: 2.7 mmol/L — AB (ref 0.5–1.9)
Lactic Acid, Venous: 1.4 mmol/L (ref 0.5–1.9)

## 2016-07-25 LAB — RAPID URINE DRUG SCREEN, HOSP PERFORMED
Amphetamines: NOT DETECTED
BARBITURATES: NOT DETECTED
BENZODIAZEPINES: NOT DETECTED
COCAINE: NOT DETECTED
Opiates: NOT DETECTED
TETRAHYDROCANNABINOL: POSITIVE — AB

## 2016-07-25 LAB — MAGNESIUM: Magnesium: 1.7 mg/dL (ref 1.7–2.4)

## 2016-07-25 LAB — PROCALCITONIN: Procalcitonin: <0.1 ng/mL

## 2016-07-25 LAB — GLUCOSE, CAPILLARY: GLUCOSE-CAPILLARY: 148 mg/dL — AB (ref 65–99)

## 2016-07-25 MED ORDER — LORAZEPAM 2 MG/ML IJ SOLN
0.5000 mg | Freq: Three times a day (TID) | INTRAMUSCULAR | Status: DC | PRN
Start: 2016-07-25 — End: 2016-07-28
  Administered 2016-07-25 – 2016-07-26 (×4): 0.5 mg via INTRAVENOUS
  Filled 2016-07-25 (×4): qty 1

## 2016-07-25 MED ORDER — ACETAMINOPHEN 325 MG PO TABS
650.0000 mg | ORAL_TABLET | Freq: Every day | ORAL | Status: DC | PRN
  Administered 2016-07-27: 650 mg via ORAL
  Filled 2016-07-25: qty 2

## 2016-07-25 MED ORDER — SODIUM CHLORIDE 0.9 % IV SOLN
INTRAVENOUS | Status: DC
  Administered 2016-07-25: 16:00:00 via INTRAVENOUS
  Administered 2016-07-25: 1000 mL via INTRAVENOUS
  Administered 2016-07-26 – 2016-07-27 (×2): via INTRAVENOUS

## 2016-07-25 MED ORDER — SODIUM CHLORIDE 0.9 % IV SOLN
12.5000 mg | Freq: Three times a day (TID) | INTRAVENOUS | Status: DC | PRN
  Administered 2016-07-25: 12.5 mg via INTRAVENOUS
  Filled 2016-07-25 (×3): qty 0.5

## 2016-07-25 MED ORDER — POTASSIUM CHLORIDE 10 MEQ/100ML IV SOLN
10.0000 meq | INTRAVENOUS | Status: AC
  Administered 2016-07-25 (×4): 10 meq via INTRAVENOUS
  Filled 2016-07-25 (×4): qty 100

## 2016-07-25 MED ORDER — ENOXAPARIN SODIUM 40 MG/0.4ML ~~LOC~~ SOLN
40.0000 mg | SUBCUTANEOUS | Status: DC
  Administered 2016-07-25 – 2016-07-27 (×3): 40 mg via SUBCUTANEOUS
  Filled 2016-07-25 (×5): qty 0.4

## 2016-07-25 MED ORDER — ONDANSETRON HCL 4 MG/2ML IJ SOLN
4.0000 mg | Freq: Three times a day (TID) | INTRAMUSCULAR | Status: DC | PRN
  Administered 2016-07-25 – 2016-07-29 (×6): 4 mg via INTRAVENOUS
  Filled 2016-07-25 (×6): qty 2

## 2016-07-25 MED ORDER — MORPHINE SULFATE (PF) 2 MG/ML IV SOLN: 2.0000 mg | INTRAVENOUS | Status: DC | PRN

## 2016-07-25 MED ORDER — HYDROXYZINE HCL 10 MG PO TABS
10.0000 mg | ORAL_TABLET | Freq: Two times a day (BID) | ORAL | Status: DC
  Administered 2016-07-25 – 2016-07-28 (×2): 10 mg via ORAL
  Filled 2016-07-25 (×9): qty 1

## 2016-07-25 MED ORDER — SERTRALINE HCL 20 MG/ML PO CONC
25.0000 mg | Freq: Every day | ORAL | Status: DC
  Administered 2016-07-25: 25 mg via ORAL
  Filled 2016-07-25 (×2): qty 1.25

## 2016-07-25 MED ORDER — HYDRALAZINE HCL 20 MG/ML IJ SOLN
5.0000 mg | INTRAMUSCULAR | Status: DC | PRN
  Administered 2016-07-28: 5 mg via INTRAVENOUS
  Filled 2016-07-25: qty 1

## 2016-07-25 MED ORDER — HYDROXYZINE HCL 25 MG PO TABS: 25.0000 mg | ORAL_TABLET | Freq: Two times a day (BID) | ORAL | Status: DC

## 2016-07-25 MED ORDER — ZOLPIDEM TARTRATE 5 MG PO TABS
5.0000 mg | ORAL_TABLET | Freq: Every evening | ORAL | Status: DC | PRN
  Administered 2016-07-25 – 2016-07-27 (×4): 5 mg via ORAL
  Filled 2016-07-25 (×4): qty 1

## 2016-07-25 MED ORDER — ADULT MULTIVITAMIN W/MINERALS CH
1.0000 | ORAL_TABLET | Freq: Every day | ORAL | Status: DC
  Administered 2016-07-25: 1 via ORAL
  Filled 2016-07-25 (×3): qty 1

## 2016-07-25 MED ORDER — TRAZODONE HCL 50 MG PO TABS
100.0000 mg | ORAL_TABLET | Freq: Every day | ORAL | Status: DC
  Administered 2016-07-25 – 2016-07-27 (×3): 100 mg via ORAL
  Filled 2016-07-25 (×3): qty 2

## 2016-07-25 MED ORDER — FAMOTIDINE IN NACL 20-0.9 MG/50ML-% IV SOLN
20.0000 mg | Freq: Two times a day (BID) | INTRAVENOUS | Status: DC
  Administered 2016-07-25 – 2016-07-29 (×9): 20 mg via INTRAVENOUS
  Filled 2016-07-25 (×9): qty 50

## 2016-07-25 NOTE — Progress Notes (Addendum)
Nursing Note:Pt arrived via stretcher.Pt awake,laert oriented x4.Pt says she still feels nauseated and  Would like something to help her sleep.Will give ativan IV and ambien.Pt NPO except for sips w/ meds and pt is aware.Pt has female friend at the bedside to stay with her.wbb

## 2016-07-25 NOTE — ED Notes (Signed)
Report given to floor RN 3W.

## 2016-07-25 NOTE — Progress Notes (Signed)
[facility]  H & P and chart reviewed and patient examined. She states symptoms are better today and is willing a trial of clear liquids.  Exam: mild epigastric tenderness, Bowel sounds +, non-distended  Principal Problem:    Intractable vomiting with nausea with leukocytosis - gastroenteritis? - improving- start clear liquids- has had work up last year when admitted for similar symptoms including CT scans, EGD and Ultrasound- ultrasound abdomen today reveal a 3mm gallbladder polyp which is unchanged    Anxiety/ Depression/ PTSD - psych attempted to evaluate but patient did not wake up to speak with him - outpt psych f/u    Elevated blood pressure - on admission- improved- no h/o of this- likely due to acute stress    Metabolic acidosis - due to GI symptoms? - improving- follow  Hypokalemia - replace- Mg normal   Calvert CantorSaima Cheryln Balcom, MD

## 2016-07-25 NOTE — Progress Notes (Signed)
Nursing Note: Pt arrived from ED via stretcher,awake,lert and oriented and resting quietly in bed.Pt has female visitor at the bedside who will be staying the night with her.T-97.5 P-74 R-16 Bp-139/86 PO2 100 % on r/a.Pt oriented to room and is aware she is NPO.wbb

## 2016-07-25 NOTE — Progress Notes (Signed)
Nursing Note: Paged Dr.Niu results of EKG.normal sinus rhythm w/ sinus arrythmia.Pt resting  Quietly in bed w/o complaints.wbb
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
## 2016-07-26 DIAGNOSIS — F4310 Post-traumatic stress disorder, unspecified: Secondary | ICD-10-CM

## 2016-07-26 DIAGNOSIS — F329 Major depressive disorder, single episode, unspecified: Secondary | ICD-10-CM

## 2016-07-26 DIAGNOSIS — R1110 Vomiting, unspecified: Secondary | ICD-10-CM

## 2016-07-26 DIAGNOSIS — R6510 Systemic inflammatory response syndrome (SIRS) of non-infectious origin without acute organ dysfunction: Secondary | ICD-10-CM

## 2016-07-26 LAB — BASIC METABOLIC PANEL
Anion gap: 7 (ref 5–15)
BUN: 11 mg/dL (ref 6–20)
CALCIUM: 8.8 mg/dL — AB (ref 8.9–10.3)
CO2: 22 mmol/L (ref 22–32)
CREATININE: 0.72 mg/dL (ref 0.44–1.00)
Chloride: 110 mmol/L (ref 101–111)
Glucose, Bld: 94 mg/dL (ref 65–99)
Potassium: 3.3 mmol/L — ABNORMAL LOW (ref 3.5–5.1)
SODIUM: 139 mmol/L (ref 135–145)

## 2016-07-26 LAB — URINE CULTURE

## 2016-07-26 LAB — CBC
HCT: 36.9 % (ref 36.0–46.0)
HEMOGLOBIN: 12.5 g/dL (ref 12.0–15.0)
MCH: 28.8 pg (ref 26.0–34.0)
MCHC: 33.9 g/dL (ref 30.0–36.0)
MCV: 85 fL (ref 78.0–100.0)
Platelets: 307 10*3/uL (ref 150–400)
RBC: 4.34 MIL/uL (ref 3.87–5.11)
RDW: 12.3 % (ref 11.5–15.5)
WBC: 8.6 10*3/uL (ref 4.0–10.5)

## 2016-07-26 MED ORDER — POTASSIUM CHLORIDE 10 MEQ/100ML IV SOLN
10.0000 meq | INTRAVENOUS | Status: AC
Start: 2016-07-26 — End: 2016-07-26
  Administered 2016-07-26 (×4): 10 meq via INTRAVENOUS
  Filled 2016-07-26 (×4): qty 100

## 2016-07-26 MED ORDER — PROMETHAZINE HCL 25 MG/ML IJ SOLN
25.0000 mg | Freq: Four times a day (QID) | INTRAMUSCULAR | Status: DC | PRN
  Administered 2016-07-26 – 2016-07-27 (×3): 25 mg via INTRAVENOUS
  Filled 2016-07-26 (×3): qty 1
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
## 2016-07-26 NOTE — Progress Notes (Addendum)
PROGRESS NOTE    Stacey Russell  ONG:[id number] DOB: [birth year] DOA: 07/24/2016  PCP: Pcp Not In System   Brief Narrative:  20 y/o with depression, anxiety, gallbladder polyp, who presents with nausea and vomiting without fevers or diarrhea.   Subjective: Nausea resolved. Normal BM yesterday. No abdominal pain. Wanting to advance diet today.   Assessment & Plan:  Intractable vomiting with nausea with leukocytosis - gastroenteritis? -was improving- tolerating clear liquids yesterday- advanced diet this morning but now vomiting - will change back to NPO until nausea and vomiting settles - has had work up last year when admitted for similar symptoms including CT scans, EGD and Ultrasound- ultrasound abdomen on 9/24 reveals a 3mm gallbladder polyp which is unchanged    Anxiety/ Depression/ PTSD - psychiatrist has recommended Remeron instead of Trazodone and Ambien and outpt f/u- will discuss with patient    Elevated blood pressure - on admission- improved- no h/o of this- likely due to acute stress    Metabolic acidosis - due to GI symptoms? - resolved  Hypokalemia - replacing- Mg normal    DVT prophylaxis: Lovenox Code Status: Full code Family Communication:  Disposition Plan: home when tolerating solid food Consultants:   none Procedures:   none Antimicrobials:  Anti-infectives    None       Objective: Vitals:   07/25/16 0550 07/25/16 1500 07/25/16 2104 07/26/16 0501  BP: 136/72 126/68 (!) 128/58 115/73  Pulse: 66 79 73 65  Resp: 16 16 16 16   Temp: 98.7 F (37.1 C) 98.3 F (36.8 C) 98.3 F (36.8 C) 97.8 F (36.6 C)  TempSrc: Oral Oral Oral Oral  SpO2: 100% 98% 98% 100%  Weight:      Height:        Intake/Output Summary (Last 24 hours) at 07/26/16 1021 Last data filed at 07/26/16 0501  Gross per 24 hour  Intake              715 ml  Output              650 ml  Net               65 ml   Filed Weights   07/24/16 1940 07/25/16 0210  Weight:  72.6 kg (160 lb) 88.3 kg (194 lb 11.2 oz)    Examination: General exam: Appears comfortable  HEENT: PERRLA, oral mucosa moist, no sclera icterus or thrush Respiratory system: Clear to auscultation. Respiratory effort normal. Cardiovascular system: S1 & S2 heard, RRR.  No murmurs  Gastrointestinal system: Abdomen soft, non-tender, nondistended. Normal bowel sound. No organomegaly Central nervous system: Alert and oriented. No focal neurological deficits. Extremities: No cyanosis, clubbing or edema Skin: No rashes or ulcers Psychiatry:  Mood & affect appropriate.     Data Reviewed: I have personally reviewed following labs and imaging studies  CBC:  Recent Labs Lab 07/24/16 2002 07/25/16 0404 07/26/16 0833  WBC 15.6* 16.1* 8.6  HGB 14.9 13.0 12.5  HCT 42.5 37.9 36.9  MCV 85.7 84.4 85.0  PLT 436* 334 307   Basic Metabolic Panel:  Recent Labs Lab 07/24/16 2002 07/25/16 0402 07/25/16 0404 07/26/16 0833  NA 139  --  137 139  K 3.6  --  3.3* 3.3*  CL 107  --  105 110  CO2 17*  --  21* 22  GLUCOSE 158*  --  148* 94  BUN 11  --  8 11  CREATININE 0.65  --  0.61 0.72  CALCIUM 10.2  --  8.7* 8.8*  MG  --  1.7  --   --    GFR: Estimated Creatinine Clearance: 127 mL/min (by C-G formula based on SCr of 0.72 mg/dL). Liver Function Tests:  Recent Labs Lab 07/24/16 2002  AST 22  ALT 26  ALKPHOS 57  BILITOT 0.7  PROT 8.7*  ALBUMIN 5.4*    Recent Labs Lab 07/24/16 2002  LIPASE 20   No results for input(s): AMMONIA in the last 168 hours. Coagulation Profile:  Recent Labs Lab 07/25/16 0658  INR 1.08   Cardiac Enzymes: No results for input(s): CKTOTAL, CKMB, CKMBINDEX, TROPONINI in the last 168 hours. BNP (last 3 results) No results for input(s): PROBNP in the last 8760 hours. HbA1C: No results for input(s): HGBA1C in the last 72 hours. CBG:  Recent Labs Lab 07/25/16 0830  GLUCAP 148*   Lipid Profile: No results for input(s): CHOL, HDL, LDLCALC,  TRIG, CHOLHDL, LDLDIRECT in the last 72 hours. Thyroid Function Tests: No results for input(s): TSH, T4TOTAL, FREET4, T3FREE, THYROIDAB in the last 72 hours. Anemia Panel: No results for input(s): VITAMINB12, FOLATE, FERRITIN, TIBC, IRON, RETICCTPCT in the last 72 hours. Urine analysis:    Component Value Date/Time   COLORURINE YELLOW 07/24/2016 1943   APPEARANCEUR CLOUDY (A) 07/24/2016 1943   LABSPEC 1.029 07/24/2016 1943   PHURINE 6.5 07/24/2016 1943   GLUCOSEU NEGATIVE 07/24/2016 1943   HGBUR TRACE (A) 07/24/2016 1943   BILIRUBINUR NEGATIVE 07/24/2016 1943   KETONESUR >80 (A) 07/24/2016 1943   PROTEINUR NEGATIVE 07/24/2016 1943   UROBILINOGEN 1.0 04/30/2015 1235   NITRITE NEGATIVE 07/24/2016 1943   LEUKOCYTESUR SMALL (A) 07/24/2016 1943   Sepsis Labs: @LABRCNTIP (procalcitonin:4,lacticidven:4) ) Recent Results (from the past 240 hour(s))  Urine culture     Status: Abnormal   Collection Time: 07/25/16  3:14 AM  Result Value Ref Range Status   Specimen Description URINE, RANDOM  Final   Special Requests NONE  Final   Culture (A)  Final    5,000 COLONIES/mL INSIGNIFICANT GROWTH Performed at [facility]    Report Status 07/26/2016 FINAL  Final         Radiology Studies: US Abdomen Complete  Result Date: 07/25/2016 CLINICAL DATA:  Nausea and vomiting. EXAM: ABDOMEN ULTRASOUND COMPLETE COMPARISON:  04/29/2015 and the CT of 04/25/2015. FINDINGS: Gallbladder: 3 mm gallbladder polyp. No stone, wall thickening, or pericholecystic fluid. Sonographic Murphy's sign was not elicited. Common bile duct: Diameter: Normal, 4 mm. Liver: No focal lesion identified. Within normal limits in parenchymal echogenicity. IVC: No abnormality visualized. Pancreas: Visualized portion unremarkable. Spleen: Size and appearance within normal limits. Right Kidney: Length: 11.8 cm. Echogenicity within normal limits. No mass or hydronephrosis visualized. Left Kidney: Length: 11.3 cm. Echogenicity  within normal limits. No mass or hydronephrosis visualized. Abdominal aorta: No aneurysm visualized. Other findings: No ascites. IMPRESSION: 1.  No acute process or explanation for nausea/vomiting. 2. 3 mm gallbladder polyp, as before. Per consensus criteria, this can be presumed benign and does not warrant follow-up imaging. Electronically Signed   By: Jeronimo Greaves M.D.   On: 07/25/2016 08:54   Dg Chest Port 1 View  Result Date: 07/25/2016 CLINICAL DATA:  Sepsis. Shortness of breath. Nausea and vomiting yesterday. Abdominal pain, fever, and chills. Nonsmoker. EXAM: PORTABLE CHEST 1 VIEW COMPARISON:  None. FINDINGS: The heart size and mediastinal contours are within normal limits. Both lungs are clear. The visualized skeletal structures are unremarkable. IMPRESSION: No active disease. Electronically Signed   By: Marisa Cyphers.D.  On: 07/25/2016 05:04    Scheduled Meds: . enoxaparin (LOVENOX) injection  40 mg Subcutaneous Q24H  . famotidine (PEPCID) IV  20 mg Intravenous Q12H  . hydrOXYzine  10 mg Oral BID PC  . multivitamin with minerals  1 tablet Oral Daily  . traZODone  100 mg Oral QHS   Continuous Infusions: . sodium chloride 75 mL/hr at 07/25/16 1554     LOS: 1 day    Time spent in minutes: 35    Abbigael Detlefsen, MD [facility] Pager: [email or web address] Password [email or web address] 07/26/2016, 10:21 AM

## 2016-07-27 DIAGNOSIS — F418 Other specified anxiety disorders: Secondary | ICD-10-CM

## 2016-07-27 LAB — BASIC METABOLIC PANEL
Anion gap: 12 (ref 5–15)
BUN: 8 mg/dL (ref 6–20)
CHLORIDE: 102 mmol/L (ref 101–111)
CO2: 22 mmol/L (ref 22–32)
CREATININE: 0.59 mg/dL (ref 0.44–1.00)
Calcium: 9.1 mg/dL (ref 8.9–10.3)
GFR calc Af Amer: >60 mL/min (ref >60)
GFR calc non Af Amer: >60 mL/min (ref >60)
GLUCOSE: 93 mg/dL (ref 65–99)
Potassium: 3 mmol/L — ABNORMAL LOW (ref 3.5–5.1)
SODIUM: 136 mmol/L (ref 135–145)

## 2016-07-27 LAB — MAGNESIUM: MAGNESIUM: 1.8 mg/dL (ref 1.7–2.4)

## 2016-07-27 MED ORDER — POTASSIUM CHLORIDE 10 MEQ/100ML IV SOLN
10.0000 meq | INTRAVENOUS | Status: AC
Start: 2016-07-27 — End: 2016-07-27
  Administered 2016-07-27 (×4): 10 meq via INTRAVENOUS
  Filled 2016-07-27 (×4): qty 100

## 2016-07-27 NOTE — Progress Notes (Signed)
PROGRESS NOTE    Stacey Russell  WUJ:[id number] DOB: 1996-07-21 DOA: 07/24/2016  PCP: Pcp Not In System   Brief Narrative:  20 y/o with depression, anxiety, gallbladder polyp, who presents with nausea and vomiting without fevers or diarrhea.   Subjective: Recurrent nausea and vomiting after advancing diet today.  Assessment & Plan:  Intractable vomiting with nausea with leukocytosis - gastroenteritis Vs psychological -was improving- tolerating clear liquids but had recurrence of symptoms upon advancing diet yesterday morning- have downgraded to clears again - - has had work up last year when admitted for similar symptoms including CT scans, EGD and Ultrasound- ultrasound abdomen on 9/24 reveals a 3mm gallbladder polyp which is unchanged    Anxiety/ Depression/ PTSD - psychiatrist has recommended Remeron instead of Trazodone and Ambien and outpt f/u - will consider switching medications prior to discharge once her vomiting has resolved    Elevated blood pressure - on admission- improved- no h/o of this- likely due to acute stress    Metabolic acidosis - due to GI symptoms? - resolved  Hypokalemia - replacing-     DVT prophylaxis: Lovenox Code Status: Full code Family Communication:  Disposition Plan: home when tolerating solid food Consultants:   none Procedures:   none Antimicrobials:  Anti-infectives    None       Objective: Vitals:   07/26/16 0501 07/26/16 1314 07/26/16 2121 07/27/16 0541  BP: 115/73 (!) 171/112 120/73 128/65  Pulse: 65 (!) 58 60 77  Resp: 16 16 16 16   Temp: 97.8 F (36.6 C) 98.3 F (36.8 C) 98.5 F (36.9 C) 98.1 F (36.7 C)  TempSrc: Oral Oral Oral Oral  SpO2: 100% 100% 100% 98%  Weight:      Height:        Intake/Output Summary (Last 24 hours) at 07/27/16 1204 Last data filed at 07/27/16 0544  Gross per 24 hour  Intake          1270.83 ml  Output             1725 ml  Net          -454.17 ml   Filed Weights   07/24/16 1940 07/25/16 0210  Weight: 72.6 kg (160 lb) 88.3 kg (194 lb 11.2 oz)    Examination: General exam: Appears comfortable  HEENT: PERRLA, oral mucosa moist, no sclera icterus or thrush Respiratory system: Clear to auscultation. Respiratory effort normal. Cardiovascular system: S1 & S2 heard, RRR.  No murmurs  Gastrointestinal system: Abdomen soft, non-tender, nondistended. Normal bowel sound. No organomegaly Central nervous system: Alert and oriented. No focal neurological deficits. Extremities: No cyanosis, clubbing or edema Skin: No rashes or ulcers Psychiatry:  Mood & affect appropriate.     Data Reviewed: I have personally reviewed following labs and imaging studies  CBC:  Recent Labs Lab 07/24/16 2002 07/25/16 0404 07/26/16 0833  WBC 15.6* 16.1* 8.6  HGB 14.9 13.0 12.5  HCT 42.5 37.9 36.9  MCV 85.7 84.4 85.0  PLT 436* 334 307   Basic Metabolic Panel:  Recent Labs Lab 07/24/16 2002 07/25/16 0402 07/25/16 0404 07/26/16 0833 07/27/16 0411  NA 139  --  137 139 136  K 3.6  --  3.3* 3.3* 3.0*  CL 107  --  105 110 102  CO2 17*  --  21* 22 22  GLUCOSE 158*  --  148* 94 93  BUN 11  --  8 11 8   CREATININE 0.65  --  0.61 0.72 0.59  CALCIUM 10.2  --  8.7* 8.8* 9.1  MG  --  1.7  --   --   --    GFR: Estimated Creatinine Clearance: 127 mL/min (by C-G formula based on SCr of 0.59 mg/dL). Liver Function Tests:  Recent Labs Lab 07/24/16 2002  AST 22  ALT 26  ALKPHOS 57  BILITOT 0.7  PROT 8.7*  ALBUMIN 5.4*    Recent Labs Lab 07/24/16 2002  LIPASE 20   No results for input(s): AMMONIA in the last 168 hours. Coagulation Profile:  Recent Labs Lab 07/25/16 0658  INR 1.08   Cardiac Enzymes: No results for input(s): CKTOTAL, CKMB, CKMBINDEX, TROPONINI in the last 168 hours. BNP (last 3 results) No results for input(s): PROBNP in the last 8760 hours. HbA1C: No results for input(s): HGBA1C in the last 72 hours. CBG:  Recent Labs Lab  07/25/16 0830  GLUCAP 148*   Lipid Profile: No results for input(s): CHOL, HDL, LDLCALC, TRIG, CHOLHDL, LDLDIRECT in the last 72 hours. Thyroid Function Tests: No results for input(s): TSH, T4TOTAL, FREET4, T3FREE, THYROIDAB in the last 72 hours. Anemia Panel: No results for input(s): VITAMINB12, FOLATE, FERRITIN, TIBC, IRON, RETICCTPCT in the last 72 hours. Urine analysis:    Component Value Date/Time   COLORURINE YELLOW 07/24/2016 1943   APPEARANCEUR CLOUDY (A) 07/24/2016 1943   LABSPEC 1.029 07/24/2016 1943   PHURINE 6.5 07/24/2016 1943   GLUCOSEU NEGATIVE 07/24/2016 1943   HGBUR TRACE (A) 07/24/2016 1943   BILIRUBINUR NEGATIVE 07/24/2016 1943   KETONESUR >80 (A) 07/24/2016 1943   PROTEINUR NEGATIVE 07/24/2016 1943   UROBILINOGEN 1.0 04/30/2015 1235   NITRITE NEGATIVE 07/24/2016 1943   LEUKOCYTESUR SMALL (A) 07/24/2016 1943   Sepsis Labs: @LABRCNTIP (procalcitonin:4,lacticidven:4) ) Recent Results (from the past 240 hour(s))  Urine culture     Status: Abnormal   Collection Time: 07/25/16  3:14 AM  Result Value Ref Range Status   Specimen Description URINE, RANDOM  Final   Special Requests NONE  Final   Culture (A)  Final    5,000 COLONIES/mL INSIGNIFICANT GROWTH [facility]    Report Status 07/26/2016 FINAL  Final         Radiology Studies: No results found.  Scheduled Meds: . enoxaparin (LOVENOX) injection  40 mg Subcutaneous Q24H  . famotidine (PEPCID) IV  20 mg Intravenous Q12H  . hydrOXYzine  10 mg Oral BID PC  . multivitamin with minerals  1 tablet Oral Daily  . traZODone  100 mg Oral QHS   Continuous Infusions: . sodium chloride 100 mL/hr at 07/26/16 1359     LOS: 2 days    Time spent in minutes: 35    Martyna Thorns, MD [facility] Pager: [email or web address] Password [email or web address] 07/27/2016, 12:04 PM

## 2016-07-28 MED ORDER — IBUPROFEN 200 MG PO TABS: 400.0000 mg | ORAL_TABLET | Freq: Three times a day (TID) | ORAL | Status: DC | PRN

## 2016-07-28 MED ORDER — LORAZEPAM 0.5 MG PO TABS
0.5000 mg | ORAL_TABLET | Freq: Three times a day (TID) | ORAL | Status: DC | PRN
  Administered 2016-07-28: 0.5 mg via ORAL
  Filled 2016-07-28: qty 1

## 2016-07-28 MED ORDER — MIRTAZAPINE 15 MG PO TABS
7.5000 mg | ORAL_TABLET | Freq: Every day | ORAL | Status: DC
  Administered 2016-07-28: 7.5 mg via ORAL
  Filled 2016-07-28: qty 1

## 2016-07-28 MED ORDER — POTASSIUM CHLORIDE CRYS ER 20 MEQ PO TBCR
40.0000 meq | EXTENDED_RELEASE_TABLET | Freq: Four times a day (QID) | ORAL | Status: AC
  Administered 2016-07-28: 40 meq via ORAL
  Filled 2016-07-28: qty 2

## 2016-07-28 MED ORDER — PROMETHAZINE HCL 25 MG/ML IJ SOLN
12.5000 mg | Freq: Four times a day (QID) | INTRAMUSCULAR | Status: DC | PRN
  Administered 2016-07-28 – 2016-07-29 (×5): 12.5 mg via INTRAVENOUS
  Filled 2016-07-28 (×5): qty 1

## 2016-07-28 NOTE — Progress Notes (Signed)
PROGRESS NOTE    Stacey Russell  ZOX:[id number] DOB: 1996-03-11 DOA: 07/24/2016  PCP: Pcp Not In System   Brief Narrative:  20 y/o with depression, anxiety, gallbladder polyp, who presents with nausea and vomiting without fevers or diarrhea.   Subjective: Reports no recurrence of nausea and vomiting overnight, and no further abdominal pain  Assessment & Plan   Intractable vomiting with nausea with leukocytosis - gastroenteritis Vs psychological a(report social stressors including work, relocation and financial) - has had work up last year when admitted for similar symptoms including CT scans, EGD and Ultrasound- ultrasound abdomen on 9/24 reveals a 3mm gallbladder polyp which is unchanged - Denies any nausea or vomiting overnight, will start on full liquid diet and advance as tolerated, with transition Phenergan and Ativan 2 by mouth.  Anxiety/ Depression/ PTSD - psychiatrist has recommended Remeron instead of Trazodone and Ambien and outpt f/u  Elevated blood pressure - on admission- improved- no h/o of this- likely due to acute stress  Metabolic acidosis - due to GI symptoms? - resolved  Hypokalemia - replacing, recheck in am.   DVT prophylaxis: Lovenox Code Status: Full code Family Communication: Friend at bedside Disposition Plan: home when tolerating solid food Consultants:   none Procedures:   none Antimicrobials:  Anti-infectives    None       Objective: Vitals:   07/27/16 0541 07/27/16 1400 07/27/16 2100 07/28/16 0518  BP: 128/65 (!) 110/55 136/85 131/80  Pulse: 77 71 68 (!) 57  Resp: 16 18 16 16   Temp: 98.1 F (36.7 C) 98.6 F (37 C) 98.5 F (36.9 C) 97.7 F (36.5 C)  TempSrc: Oral Oral Oral Oral  SpO2: 98% 99% 99% 98%  Weight:      Height:        Intake/Output Summary (Last 24 hours) at 07/28/16 1046 Last data filed at 07/28/16 0646  Gross per 24 hour  Intake             1235 ml  Output              800 ml  Net              435  ml   Filed Weights   07/24/16 1940 07/25/16 0210  Weight: 72.6 kg (160 lb) 88.3 kg (194 lb 11.2 oz)    Examination: General exam: Appears comfortable  HEENT: PERRLA, oral mucosa moist, no sclera icterus or thrush Respiratory system: Clear to auscultation. Respiratory effort normal. Cardiovascular system: S1 & S2 heard, RRR.  No murmurs  Gastrointestinal system: Abdomen soft, non-tender, nondistended. Normal bowel sound. No organomegaly Central nervous system: Alert and oriented. No focal neurological deficits. Extremities: No cyanosis, clubbing or edema Skin: No rashes or ulcers Psychiatry:  Mood & affect appropriate.     Data Reviewed: I have personally reviewed following labs and imaging studies  CBC:  Recent Labs Lab 07/24/16 2002 07/25/16 0404 07/26/16 0833  WBC 15.6* 16.1* 8.6  HGB 14.9 13.0 12.5  HCT 42.5 37.9 36.9  MCV 85.7 84.4 85.0  PLT 436* 334 307   Basic Metabolic Panel:  Recent Labs Lab 07/24/16 2002 07/25/16 0402 07/25/16 0404 07/26/16 0833 07/27/16 0411  NA 139  --  137 139 136  K 3.6  --  3.3* 3.3* 3.0*  CL 107  --  105 110 102  CO2 17*  --  21* 22 22  GLUCOSE 158*  --  148* 94 93  BUN 11  --  8 11 8  CREATININE 0.65  --  0.61 0.72 0.59  CALCIUM 10.2  --  8.7* 8.8* 9.1  MG  --  1.7  --   --  1.8   GFR: Estimated Creatinine Clearance: 127 mL/min (by C-G formula based on SCr of 0.59 mg/dL). Liver Function Tests:  Recent Labs Lab 07/24/16 2002  AST 22  ALT 26  ALKPHOS 57  BILITOT 0.7  PROT 8.7*  ALBUMIN 5.4*    Recent Labs Lab 07/24/16 2002  LIPASE 20   No results for input(s): AMMONIA in the last 168 hours. Coagulation Profile:  Recent Labs Lab 07/25/16 0658  INR 1.08   Cardiac Enzymes: No results for input(s): CKTOTAL, CKMB, CKMBINDEX, TROPONINI in the last 168 hours. BNP (last 3 results) No results for input(s): PROBNP in the last 8760 hours. HbA1C: No results for input(s): HGBA1C in the last 72  hours. CBG:  Recent Labs Lab 07/25/16 0830  GLUCAP 148*   Lipid Profile: No results for input(s): CHOL, HDL, LDLCALC, TRIG, CHOLHDL, LDLDIRECT in the last 72 hours. Thyroid Function Tests: No results for input(s): TSH, T4TOTAL, FREET4, T3FREE, THYROIDAB in the last 72 hours. Anemia Panel: No results for input(s): VITAMINB12, FOLATE, FERRITIN, TIBC, IRON, RETICCTPCT in the last 72 hours. Urine analysis:    Component Value Date/Time   COLORURINE YELLOW 07/24/2016 1943   APPEARANCEUR CLOUDY (A) 07/24/2016 1943   LABSPEC 1.029 07/24/2016 1943   PHURINE 6.5 07/24/2016 1943   GLUCOSEU NEGATIVE 07/24/2016 1943   HGBUR TRACE (A) 07/24/2016 1943   BILIRUBINUR NEGATIVE 07/24/2016 1943   KETONESUR >80 (A) 07/24/2016 1943   PROTEINUR NEGATIVE 07/24/2016 1943   UROBILINOGEN 1.0 04/30/2015 1235   NITRITE NEGATIVE 07/24/2016 1943   LEUKOCYTESUR SMALL (A) 07/24/2016 1943   Sepsis Labs: @LABRCNTIP (procalcitonin:4,lacticidven:4) ) Recent Results (from the past 240 hour(s))  Urine culture     Status: Abnormal   Collection Time: 07/25/16  3:14 AM  Result Value Ref Range Status   Specimen Description URINE, RANDOM  Final   Special Requests NONE  Final   Culture (A)  Final    5,000 COLONIES/mL INSIGNIFICANT GROWTH Performed at [facility]    Report Status 07/26/2016 FINAL  Final         Radiology Studies: No results found.  Scheduled Meds: . enoxaparin (LOVENOX) injection  40 mg Subcutaneous Q24H  . famotidine (PEPCID) IV  20 mg Intravenous Q12H  . hydrOXYzine  10 mg Oral BID PC  . mirtazapine  7.5 mg Oral QHS  . multivitamin with minerals  1 tablet Oral Daily  . potassium chloride  40 mEq Oral Q6H   Continuous Infusions:     LOS: 3 days      Alysa Duca, MD [facility] [phone] [email or web address] Password [facility] 07/28/2016, 10:46 AM

## 2016-07-28 NOTE — Progress Notes (Signed)
Spoke with patient at bedside regarding d/c needs. States she goes to urgent care for PCP, is not happy with her current doctor but likes the clinic, she plans to ask for a new PCP there. Also provided her with other resources to obtain PCP if this does not work out. Discussed medication needs, patient states she is not currently taking any meds, was on Cymbalta but could no longer afford. Discussed assistance through mental health for mood stabilizers, she is aware, states she is waiting for CSW to come see her about those issues. Will provide Match if needed but hopefully she can use $4 list for any new meds, she says she can afford that. Plans to d/c home with her roommate who provides support as needed.
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
## 2016-07-29 LAB — BASIC METABOLIC PANEL
ANION GAP: 15 (ref 5–15)
BUN: 10 mg/dL (ref 6–20)
CHLORIDE: 102 mmol/L (ref 101–111)
CO2: 17 mmol/L — AB (ref 22–32)
Calcium: 9.5 mg/dL (ref 8.9–10.3)
Creatinine, Ser: 0.63 mg/dL (ref 0.44–1.00)
GFR calc non Af Amer: >60 mL/min (ref >60)
Glucose, Bld: 96 mg/dL (ref 65–99)
POTASSIUM: 3.2 mmol/L — AB (ref 3.5–5.1)
SODIUM: 134 mmol/L — AB (ref 135–145)

## 2016-07-29 MED ORDER — BOOST / RESOURCE BREEZE PO LIQD: 1.0000 | Freq: Three times a day (TID) | ORAL | 0 refills | Status: DC

## 2016-07-29 MED ORDER — MIRTAZAPINE 7.5 MG PO TABS: 7.5000 mg | ORAL_TABLET | Freq: Every day | ORAL | 0 refills | Status: DC

## 2016-07-29 MED ORDER — POTASSIUM CHLORIDE ER 10 MEQ PO TBCR: 20.0000 meq | EXTENDED_RELEASE_TABLET | Freq: Every day | ORAL | 0 refills | Status: DC

## 2016-07-29 MED ORDER — PROMETHAZINE HCL 12.5 MG PO TABS: 12.5000 mg | ORAL_TABLET | Freq: Three times a day (TID) | ORAL | 0 refills | Status: DC | PRN

## 2016-07-29 MED ORDER — BOOST / RESOURCE BREEZE PO LIQD
1.0000 | Freq: Three times a day (TID) | ORAL | Status: DC
  Administered 2016-07-29: 1 via ORAL

## 2016-07-29 NOTE — Progress Notes (Signed)
Initial Nutrition Assessment  DOCUMENTATION CODES:   Obesity unspecified  INTERVENTION:   -Provide Boost Breeze po TID, each supplement provides 250 kcal and 9 grams of protein -Continue Ensure Enlive po BID, each supplement provides 350 kcal and 20 grams of protein when able to tolerate -Encourage PO intake -RD to continue to monitor  NUTRITION DIAGNOSIS:   Inadequate oral intake related to nausea, vomiting as evidenced by meal completion < 25%.  GOAL:   Patient will meet greater than or equal to 90% of their needs  MONITOR:   PO intake, Supplement acceptance, Labs, Weight trends, I & O's  REASON FOR ASSESSMENT:   Rounds    ASSESSMENT:   20 y/o with depression, anxiety, gallbladder polyp, who presents with nausea and vomiting without fevers or diarrhea.   Patient identified via interdisciplinary rounds. Pt's RN states pt is not tolerating PO intake well. MD wants pt to drink and tolerate 2 Ensures today prior to discharge. Patient in room with RN at bedside. Pt reports not drinking a ensure yet. States she is having trouble tolerating clear liquids likes a popsicle. She does not feel that she will tolerate any liquid with a milky consistency. Pt willing to try Boost Breeze supplements today.  Pt reports her weight tends to fluctuate. Pt currently weighs 195 lb and states this is higher than usual. Per chart review, pt weighed 160 lb in June of 2016.  Patient shows no sign of depletion of muscle mass or body fat.  Medications: Remeron tablet daily, Multivitamin with minerals daily, IV Zofran PRN, IV Phenergan PRN Labs reviewed: CBGs: 148 Low Na, K Mg WNL  Diet Order:  Diet full liquid Room service appropriate? Yes; Fluid consistency: Thin  Skin:  Reviewed, no issues  Last BM:  9/26  Height:   Ht Readings from Last 1 Encounters:  07/25/16 5\' 7"  (1.702 m)    Weight:   Wt Readings from Last 1 Encounters:  07/25/16 194 lb 11.2 oz (88.3 kg)    Ideal Body  Weight:  61.3 kg  BMI:  Body mass index is 30.49 kg/m.  Estimated Nutritional Needs:   Kcal:  1700-1900  Protein:  65-75g  Fluid:  1.9L/day  EDUCATION NEEDS:   Education needs addressed  Tilda FrancoLindsey Rylea Selway, MS, RD, LDN Pager: [phone] After Hours Pager: [phone]
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
## 2016-07-29 NOTE — Discharge Instructions (Signed)
Follow with Primary MD in 7 days   Get CBC, CMP,checked  by Primary MD next visit.    Activity: As tolerated    Disposition Home   Diet: Full liquid diet, encouraged to drink breeze and ensure, and advance gradually as tolerated. ,     On your next visit with your primary care physician please Get Medicines reviewed and adjusted.   Please request your Prim.MD to go over all Hospital Tests and Procedure/Radiological results at the follow up, please get all Hospital records sent to your Prim MD by signing hospital release before you go home.   If you experience worsening of your admission symptoms, develop shortness of breath, life threatening emergency, suicidal or homicidal thoughts you must seek medical attention immediately by calling 911 or calling your MD immediately  if symptoms less severe.  You Must read complete instructions/literature along with all the possible adverse reactions/side effects for all the Medicines you take and that have been prescribed to you. Take any new Medicines after you have completely understood and accpet all the possible adverse reactions/side effects.   Do not drive, operating heavy machinery, perform activities at heights, swimming or participation in water activities or provide baby sitting services if your were admitted for syncope or siezures until you have seen by Primary MD or a Neurologist and advised to do so again.  Do not drive when taking Pain medications.    Do not take more than prescribed Pain, Sleep and Anxiety Medications  Special Instructions: If you have smoked or chewed Tobacco  in the last 2 yrs please stop smoking, stop any regular Alcohol  and or any Recreational drug use.  Wear Seat belts while driving.   Please note  You were cared for by a hospitalist during your hospital stay. If you have any questions about your discharge medications or the care you received while you were in the hospital after you are discharged,  you can call the unit and asked to speak with the hospitalist on call if the hospitalist that took care of you is not available. Once you are discharged, your primary care physician will handle any further medical issues. Please note that NO REFILLS for any discharge medications will be authorized once you are discharged, as it is imperative that you return to your primary care physician (or establish a relationship with a primary care physician if you do not have one) for your aftercare needs so that they can reassess your need for medications and monitor your lab values.

## 2016-07-29 NOTE — Progress Notes (Signed)
Patient alert and oriented with no pain and nausea under control. Patient given discharge instruction and MATCH letter. Patient verbalized understanding of discharge instructions and follow up instructions.

## 2016-07-29 NOTE — Progress Notes (Signed)
[facility].  Patient does not have insurance.  [facility] to assist in cost of medications.  [facility] she must get her prescriptions filled prior to 10/5 otherwise, the [facility]MATCH letter will expire.  EDCM explained that this is a once a year assistance program and will nopt be able to assist her again with cost of medications until this time next year.  Patient verbalized understanding.  No further EDCM needs at this time.

## 2017-04-04 ENCOUNTER — Encounter (HOSPITAL_COMMUNITY): Payer: Self-pay | Admitting: Emergency Medicine

## 2017-04-04 ENCOUNTER — Emergency Department (HOSPITAL_COMMUNITY)
Admission: EM | Admit: 2017-04-04 | Discharge: 2017-04-05 | Disposition: A | Discharge status: Home / Self Care | Payer: Self-pay | Attending: Emergency Medicine | Admitting: Emergency Medicine

## 2017-04-04 DIAGNOSIS — R1084 Generalized abdominal pain: Secondary | ICD-10-CM

## 2017-04-04 DIAGNOSIS — Z79899 Other long term (current) drug therapy: Secondary | ICD-10-CM | POA: Insufficient documentation

## 2017-04-04 DIAGNOSIS — I10 Essential (primary) hypertension: Secondary | ICD-10-CM | POA: Insufficient documentation

## 2017-04-04 DIAGNOSIS — F419 Anxiety disorder, unspecified: Secondary | ICD-10-CM

## 2017-04-04 HISTORY — DX: Essential (primary) hypertension: I10

## 2017-04-04 LAB — CBC WITH DIFFERENTIAL/PLATELET
BASOS ABS: 0 10*3/uL (ref 0.0–0.1)
BASOS PCT: 0 %
EOS PCT: 0 %
Eosinophils Absolute: 0 10*3/uL (ref 0.0–0.7)
HEMATOCRIT: 42 % (ref 36.0–46.0)
Hemoglobin: 15.1 g/dL — ABNORMAL HIGH (ref 12.0–15.0)
Lymphocytes Relative: 5 %
Lymphs Abs: 1 10*3/uL (ref 0.7–4.0)
MCH: 29.8 pg (ref 26.0–34.0)
MCHC: 36 g/dL (ref 30.0–36.0)
MCV: 82.8 fL (ref 78.0–100.0)
MONO ABS: 0.3 10*3/uL (ref 0.1–1.0)
Monocytes Relative: 2 %
NEUTROS ABS: 17.9 10*3/uL — AB (ref 1.7–7.7)
Neutrophils Relative %: 93 %
Platelets: 448 10*3/uL — ABNORMAL HIGH (ref 150–400)
RBC: 5.07 MIL/uL (ref 3.87–5.11)
RDW: 12.9 % (ref 11.5–15.5)
WBC: 19.1 10*3/uL — ABNORMAL HIGH (ref 4.0–10.5)

## 2017-04-04 MED ORDER — LORAZEPAM 1 MG PO TABS
1.0000 mg | ORAL_TABLET | Freq: Once | ORAL | Status: DC
  Filled 2017-04-04: qty 1

## 2017-04-04 MED ORDER — LORAZEPAM 1 MG PO TABS
1.0000 mg | ORAL_TABLET | Freq: Once | ORAL | Status: AC
  Administered 2017-04-04: 1 mg via ORAL
  Filled 2017-04-04: qty 1

## 2017-04-04 MED ORDER — ONDANSETRON 8 MG PO TBDP
8.0000 mg | ORAL_TABLET | Freq: Once | ORAL | Status: AC
  Administered 2017-04-04: 8 mg via ORAL
  Filled 2017-04-04: qty 1

## 2017-04-04 MED ORDER — ONDANSETRON 8 MG PO TBDP
8.0000 mg | ORAL_TABLET | Freq: Once | ORAL | Status: AC
Start: 2017-04-04 — End: 2017-04-05
  Administered 2017-04-05: 8 mg via ORAL
  Filled 2017-04-04 (×2): qty 1

## 2017-04-04 NOTE — ED Notes (Signed)
Pt refused po medications  Pt states it will cause her to vomit and she wants injections  ED provider notified
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
## 2017-04-04 NOTE — ED Notes (Signed)
Patients boyfriend states that she suffers from panic attacks.

## 2017-04-04 NOTE — ED Triage Notes (Signed)
Pt brought in by boyfriend  Pt slumped over in wheelchair and not having any verbal response  Boyfriend states pt has been having a lot of financial difficulty recently and is feeling overwhelmed  States she has not ate or drank in 24 hrs  Pt drawing up her hands while in triage  Resp even and nonlabored

## 2017-04-05 ENCOUNTER — Emergency Department (HOSPITAL_COMMUNITY): Payer: Self-pay

## 2017-04-05 ENCOUNTER — Encounter (HOSPITAL_COMMUNITY): Payer: Self-pay | Admitting: Radiology

## 2017-04-05 LAB — COMPREHENSIVE METABOLIC PANEL
ALK PHOS: 77 U/L (ref 38–126)
ALT: 32 U/L (ref 14–54)
AST: 33 U/L (ref 15–41)
Albumin: 5.1 g/dL — ABNORMAL HIGH (ref 3.5–5.0)
Anion gap: 17 — ABNORMAL HIGH (ref 5–15)
BUN: 9 mg/dL (ref 6–20)
CALCIUM: 10.2 mg/dL (ref 8.9–10.3)
CO2: 18 mmol/L — ABNORMAL LOW (ref 22–32)
CREATININE: 0.71 mg/dL (ref 0.44–1.00)
Chloride: 103 mmol/L (ref 101–111)
Glucose, Bld: 147 mg/dL — ABNORMAL HIGH (ref 65–99)
Potassium: 3.6 mmol/L (ref 3.5–5.1)
Sodium: 138 mmol/L (ref 135–145)
Total Bilirubin: 0.8 mg/dL (ref 0.3–1.2)
Total Protein: 8.8 g/dL — ABNORMAL HIGH (ref 6.5–8.1)

## 2017-04-05 LAB — URINALYSIS, ROUTINE W REFLEX MICROSCOPIC
Bilirubin Urine: NEGATIVE
Glucose, UA: NEGATIVE mg/dL
Hgb urine dipstick: NEGATIVE
KETONES UR: 80 mg/dL — AB
LEUKOCYTES UA: NEGATIVE
Nitrite: NEGATIVE
PH: 8 (ref 5.0–8.0)
PROTEIN: 100 mg/dL — AB
Specific Gravity, Urine: 1.029 (ref 1.005–1.030)

## 2017-04-05 LAB — I-STAT BETA HCG BLOOD, ED (MC, WL, AP ONLY)

## 2017-04-05 LAB — LIPASE, BLOOD: LIPASE: 19 U/L (ref 11–51)

## 2017-04-05 LAB — POC URINE PREG, ED: PREG TEST UR: NEGATIVE

## 2017-04-05 MED ORDER — IOPAMIDOL (ISOVUE-300) INJECTION 61%
INTRAVENOUS | Status: AC
  Filled 2017-04-05: qty 100

## 2017-04-05 MED ORDER — METOCLOPRAMIDE HCL 5 MG/ML IJ SOLN
10.0000 mg | Freq: Once | INTRAMUSCULAR | Status: AC
  Administered 2017-04-05: 10 mg via INTRAVENOUS
  Filled 2017-04-05: qty 2

## 2017-04-05 MED ORDER — SODIUM CHLORIDE 0.9 % IV BOLUS (SEPSIS)
1000.0000 mL | Freq: Once | INTRAVENOUS | Status: AC
  Administered 2017-04-05: 1000 mL via INTRAVENOUS

## 2017-04-05 MED ORDER — MORPHINE SULFATE (PF) 2 MG/ML IV SOLN
4.0000 mg | Freq: Once | INTRAVENOUS | Status: AC
  Administered 2017-04-05: 4 mg via INTRAVENOUS
  Filled 2017-04-05: qty 2

## 2017-04-05 MED ORDER — IOPAMIDOL (ISOVUE-300) INJECTION 61%
100.0000 mL | Freq: Once | INTRAVENOUS | Status: AC | PRN
  Administered 2017-04-05: 100 mL via INTRAVENOUS

## 2017-04-05 MED ORDER — ONDANSETRON HCL 4 MG/2ML IJ SOLN
4.0000 mg | Freq: Once | INTRAMUSCULAR | Status: AC
  Administered 2017-04-05: 4 mg via INTRAVENOUS
  Filled 2017-04-05: qty 2

## 2017-04-05 NOTE — ED Provider Notes (Signed)
Patient signed out to me by Jim DesanctisNadeau, PA-C.  Patient came in with anxiety and panic attack. She has had nausea and vomiting.  CT scan was ordered by prior provider, which is negative. Patient given fluids.  She is comfortable now.  Mildly elevated anion gap and white count noted in labs, but after fluids patient is feeling well and appears stable for discharge for outpatient follow-up.  She agrees with this plan and would like outpatient resources.     Roxy HorsemanBrowning, Stacey Tracey, PA-C 04/05/17 [phone]    Shon BatonHorton, Stacey F, MD 04/06/17 [phone]

## 2017-04-06 ENCOUNTER — Emergency Department (HOSPITAL_COMMUNITY)
Admission: EM | Admit: 2017-04-06 | Discharge: 2017-04-06 | Disposition: A | Discharge status: Home / Self Care | Payer: BLUE CROSS/BLUE SHIELD | Attending: Emergency Medicine | Admitting: Emergency Medicine

## 2017-04-06 ENCOUNTER — Encounter (HOSPITAL_COMMUNITY): Payer: Self-pay | Admitting: Emergency Medicine

## 2017-04-06 ENCOUNTER — Encounter (HOSPITAL_COMMUNITY): Payer: Self-pay | Admitting: Family Medicine

## 2017-04-06 DIAGNOSIS — Z79899 Other long term (current) drug therapy: Secondary | ICD-10-CM | POA: Insufficient documentation

## 2017-04-06 DIAGNOSIS — I10 Essential (primary) hypertension: Secondary | ICD-10-CM | POA: Insufficient documentation

## 2017-04-06 DIAGNOSIS — Z5321 Procedure and treatment not carried out due to patient leaving prior to being seen by health care provider: Secondary | ICD-10-CM | POA: Insufficient documentation

## 2017-04-06 DIAGNOSIS — F419 Anxiety disorder, unspecified: Secondary | ICD-10-CM | POA: Insufficient documentation

## 2017-04-06 DIAGNOSIS — F411 Generalized anxiety disorder: Secondary | ICD-10-CM

## 2017-04-06 LAB — CBC WITH DIFFERENTIAL/PLATELET
BASOS ABS: 0 10*3/uL (ref 0.0–0.1)
BASOS PCT: 0 %
EOS ABS: 0 10*3/uL (ref 0.0–0.7)
EOS PCT: 0 %
HEMATOCRIT: 39.8 % (ref 36.0–46.0)
Hemoglobin: 14.1 g/dL (ref 12.0–15.0)
Lymphocytes Relative: 14 %
Lymphs Abs: 2.5 10*3/uL (ref 0.7–4.0)
MCH: 29.3 pg (ref 26.0–34.0)
MCHC: 35.4 g/dL (ref 30.0–36.0)
MCV: 82.6 fL (ref 78.0–100.0)
MONO ABS: 0.9 10*3/uL (ref 0.1–1.0)
Monocytes Relative: 5 %
NEUTROS ABS: 14.5 10*3/uL — AB (ref 1.7–7.7)
Neutrophils Relative %: 81 %
PLATELETS: 440 10*3/uL — AB (ref 150–400)
RBC: 4.82 MIL/uL (ref 3.87–5.11)
RDW: 13.2 % (ref 11.5–15.5)
WBC: 17.9 10*3/uL — ABNORMAL HIGH (ref 4.0–10.5)

## 2017-04-06 LAB — RAPID URINE DRUG SCREEN, HOSP PERFORMED
AMPHETAMINES: NOT DETECTED
BARBITURATES: NOT DETECTED
BENZODIAZEPINES: POSITIVE — AB
Cocaine: NOT DETECTED
Opiates: POSITIVE — AB
Tetrahydrocannabinol: POSITIVE — AB

## 2017-04-06 LAB — PREGNANCY, URINE: PREG TEST UR: NEGATIVE

## 2017-04-06 LAB — COMPREHENSIVE METABOLIC PANEL
ALBUMIN: 5.2 g/dL — AB (ref 3.5–5.0)
ALT: 39 U/L (ref 14–54)
AST: 37 U/L (ref 15–41)
Alkaline Phosphatase: 69 U/L (ref 38–126)
Anion gap: 14 (ref 5–15)
BUN: 13 mg/dL (ref 6–20)
CHLORIDE: 106 mmol/L (ref 101–111)
CO2: 21 mmol/L — AB (ref 22–32)
Calcium: 10 mg/dL (ref 8.9–10.3)
Creatinine, Ser: 0.79 mg/dL (ref 0.44–1.00)
GFR calc Af Amer: >60 mL/min (ref >60)
GFR calc non Af Amer: >60 mL/min (ref >60)
GLUCOSE: 147 mg/dL — AB (ref 65–99)
POTASSIUM: 3.2 mmol/L — AB (ref 3.5–5.1)
SODIUM: 141 mmol/L (ref 135–145)
Total Bilirubin: 0.9 mg/dL (ref 0.3–1.2)
Total Protein: 8.5 g/dL — ABNORMAL HIGH (ref 6.5–8.1)

## 2017-04-06 LAB — ETHANOL: Alcohol, Ethyl (B): <5 mg/dL (ref <5)

## 2017-04-06 MED ORDER — HYDROXYZINE HCL 25 MG PO TABS: 25.0000 mg | ORAL_TABLET | Freq: Four times a day (QID) | ORAL | 0 refills | Status: DC

## 2017-04-06 MED ORDER — LORAZEPAM 2 MG/ML IJ SOLN
1.0000 mg | Freq: Once | INTRAMUSCULAR | Status: AC
  Administered 2017-04-06: 1 mg via INTRAVENOUS
  Filled 2017-04-06: qty 1

## 2017-04-06 MED ORDER — HYDROXYZINE HCL 25 MG PO TABS
25.0000 mg | ORAL_TABLET | Freq: Once | ORAL | Status: DC
  Filled 2017-04-06: qty 1

## 2017-04-06 MED ORDER — HALOPERIDOL 2 MG PO TABS
2.0000 mg | ORAL_TABLET | Freq: Once | ORAL | Status: AC
  Administered 2017-04-06: 2 mg via ORAL
  Filled 2017-04-06: qty 1

## 2017-04-06 MED ORDER — KETOROLAC TROMETHAMINE 30 MG/ML IJ SOLN
30.0000 mg | Freq: Once | INTRAMUSCULAR | Status: AC
  Administered 2017-04-06: 30 mg via INTRAVENOUS
  Filled 2017-04-06: qty 1

## 2017-04-06 MED ORDER — FAMOTIDINE IN NACL 20-0.9 MG/50ML-% IV SOLN
20.0000 mg | Freq: Once | INTRAVENOUS | Status: AC
  Administered 2017-04-06: 20 mg via INTRAVENOUS
  Filled 2017-04-06: qty 50

## 2017-04-06 MED ORDER — ONDANSETRON HCL 4 MG/2ML IJ SOLN
4.0000 mg | Freq: Once | INTRAMUSCULAR | Status: AC
  Administered 2017-04-06: 4 mg via INTRAVENOUS
  Filled 2017-04-06: qty 2

## 2017-04-06 NOTE — ED Notes (Signed)
Patient calling to find a ride home

## 2017-04-06 NOTE — Discharge Instructions (Signed)
Follow up with your Physicain ?

## 2017-04-06 NOTE — ED Notes (Addendum)
Pt called from lobby with no response x3 

## 2017-04-06 NOTE — ED Notes (Signed)
Patient aware of the need for urine.   

## 2017-04-06 NOTE — ED Triage Notes (Signed)
Pt adds that she has not been able to eat for the past 3 days  Pt is c/o abd pain
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
## 2017-04-06 NOTE — ED Notes (Signed)
Patient pacing the floor back and forth. Continues calling out saying "I am anxious, please do something, I can't take it." PA aware and IV ativan is being ordered.

## 2017-04-06 NOTE — ED Notes (Signed)
Pt vomited in sink  Green liquid with some brown substance  Pt states "see it has blood in it"  No blood visualized by this recorder

## 2017-04-06 NOTE — ED Triage Notes (Signed)
Patient is from home [facility]. Pt is complaining of anxiety. Symptoms started 3 days ago. [facility] two [facility] for same symptoms. Pt has not obtained her prescription for anxiety medication that was prescribed during those visits.

## 2017-04-06 NOTE — ED Triage Notes (Signed)
Pt brought in by EMS for panic attack  Pt states it has been going on for the past 2 days and she is unable to get it under control
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
## 2017-04-06 NOTE — Progress Notes (Signed)
CSW spoke with patient regarding discharge plans. Patient voiced concerns with getting her anxiety medications. [facility] but it not attending school. Patient stated she gets along well with her roommates. Patients only concern at this time is purchasing medications. CSW will contact ED CM.   Stacy GardnerErin Kaysa Roulhac, LCSWA Clinical Social Worker [phone]

## 2017-04-06 NOTE — ED Notes (Addendum)
Stacey MakoKaren Pa, made aware of patient's c/o mid-upper abd pain and nausea and requesting medications. Patient also stated that she does need help (finiancially) getting her prescriptions/medications filled, as to why she is waiting to see social work.  No new orders given at this time.  Will continue to monitor patient's status.

## 2017-04-11 ENCOUNTER — Inpatient Hospital Stay (HOSPITAL_COMMUNITY)
Admission: RE | Admit: 2017-04-11 | Discharge: 2017-04-18 | DRG: 880 | Disposition: A | Discharge status: Home / Self Care | Payer: Self-pay | Attending: Emergency Medicine | Admitting: Emergency Medicine

## 2017-04-11 ENCOUNTER — Emergency Department (HOSPITAL_COMMUNITY)
Admission: EM | Admit: 2017-04-11 | Discharge: 2017-04-11 | Disposition: A | Discharge status: Home / Self Care | Payer: BLUE CROSS/BLUE SHIELD | Attending: Emergency Medicine | Admitting: Emergency Medicine

## 2017-04-11 ENCOUNTER — Encounter (HOSPITAL_COMMUNITY): Payer: Self-pay

## 2017-04-11 DIAGNOSIS — F411 Generalized anxiety disorder: Principal | ICD-10-CM | POA: Diagnosis present

## 2017-04-11 DIAGNOSIS — I10 Essential (primary) hypertension: Secondary | ICD-10-CM | POA: Diagnosis present

## 2017-04-11 DIAGNOSIS — R45851 Suicidal ideations: Secondary | ICD-10-CM | POA: Diagnosis present

## 2017-04-11 DIAGNOSIS — G43A0 Cyclical vomiting, not intractable: Secondary | ICD-10-CM | POA: Diagnosis present

## 2017-04-11 DIAGNOSIS — K219 Gastro-esophageal reflux disease without esophagitis: Secondary | ICD-10-CM | POA: Diagnosis present

## 2017-04-11 DIAGNOSIS — F419 Anxiety disorder, unspecified: Secondary | ICD-10-CM

## 2017-04-11 DIAGNOSIS — Z818 Family history of other mental and behavioral disorders: Secondary | ICD-10-CM

## 2017-04-11 DIAGNOSIS — E876 Hypokalemia: Secondary | ICD-10-CM

## 2017-04-11 DIAGNOSIS — F410 Panic disorder [episodic paroxysmal anxiety] without agoraphobia: Secondary | ICD-10-CM | POA: Insufficient documentation

## 2017-04-11 DIAGNOSIS — Z79899 Other long term (current) drug therapy: Secondary | ICD-10-CM | POA: Insufficient documentation

## 2017-04-11 DIAGNOSIS — R112 Nausea with vomiting, unspecified: Secondary | ICD-10-CM

## 2017-04-11 DIAGNOSIS — G4700 Insomnia, unspecified: Secondary | ICD-10-CM | POA: Diagnosis present

## 2017-04-11 DIAGNOSIS — Z008 Encounter for other general examination: Secondary | ICD-10-CM

## 2017-04-11 LAB — CBC
HCT: 40.5 % (ref 36.0–46.0)
Hemoglobin: 14.5 g/dL (ref 12.0–15.0)
MCH: 29.3 pg (ref 26.0–34.0)
MCHC: 35.8 g/dL (ref 30.0–36.0)
MCV: 81.8 fL (ref 78.0–100.0)
PLATELETS: 411 10*3/uL — AB (ref 150–400)
RBC: 4.95 MIL/uL (ref 3.87–5.11)
RDW: 12.8 % (ref 11.5–15.5)
WBC: 12 10*3/uL — AB (ref 4.0–10.5)

## 2017-04-11 LAB — SALICYLATE LEVEL

## 2017-04-11 LAB — COMPREHENSIVE METABOLIC PANEL
ALBUMIN: 5 g/dL (ref 3.5–5.0)
ALT: 83 U/L — ABNORMAL HIGH (ref 14–54)
ANION GAP: 13 (ref 5–15)
AST: 38 U/L (ref 15–41)
Alkaline Phosphatase: 65 U/L (ref 38–126)
BILIRUBIN TOTAL: 1.3 mg/dL — AB (ref 0.3–1.2)
BUN: 9 mg/dL (ref 6–20)
CO2: 22 mmol/L (ref 22–32)
Calcium: 9.7 mg/dL (ref 8.9–10.3)
Chloride: 100 mmol/L — ABNORMAL LOW (ref 101–111)
Creatinine, Ser: 0.65 mg/dL (ref 0.44–1.00)
GLUCOSE: 100 mg/dL — AB (ref 65–99)
POTASSIUM: 2.5 mmol/L — AB (ref 3.5–5.1)
Sodium: 135 mmol/L (ref 135–145)
TOTAL PROTEIN: 8 g/dL (ref 6.5–8.1)

## 2017-04-11 LAB — RAPID URINE DRUG SCREEN, HOSP PERFORMED
AMPHETAMINES: NOT DETECTED
BARBITURATES: NOT DETECTED
BENZODIAZEPINES: POSITIVE — AB
COCAINE: NOT DETECTED
OPIATES: POSITIVE — AB
TETRAHYDROCANNABINOL: POSITIVE — AB

## 2017-04-11 LAB — ETHANOL

## 2017-04-11 LAB — ACETAMINOPHEN LEVEL

## 2017-04-11 MED ORDER — ACETAMINOPHEN 325 MG PO TABS
650.0000 mg | ORAL_TABLET | Freq: Four times a day (QID) | ORAL | Status: DC | PRN
  Administered 2017-04-11 – 2017-04-16 (×2): 650 mg via ORAL
  Filled 2017-04-11 (×2): qty 2

## 2017-04-11 MED ORDER — ALUM & MAG HYDROXIDE-SIMETH 200-200-20 MG/5ML PO SUSP
30.0000 mL | ORAL | Status: DC | PRN
  Administered 2017-04-12: 30 mL via ORAL
  Filled 2017-04-11: qty 30

## 2017-04-11 MED ORDER — LORAZEPAM 1 MG PO TABS: 1.0000 mg | ORAL_TABLET | Freq: Once | ORAL | Status: DC

## 2017-04-11 MED ORDER — POTASSIUM CHLORIDE CRYS ER 20 MEQ PO TBCR: 20.0000 meq | EXTENDED_RELEASE_TABLET | Freq: Two times a day (BID) | ORAL | Status: DC

## 2017-04-11 MED ORDER — MAGNESIUM HYDROXIDE 400 MG/5ML PO SUSP: 30.0000 mL | Freq: Every day | ORAL | Status: DC | PRN

## 2017-04-11 MED ORDER — SERTRALINE HCL 25 MG PO TABS
25.0000 mg | ORAL_TABLET | Freq: Every day | ORAL | Status: DC
  Administered 2017-04-12: 25 mg via ORAL
  Filled 2017-04-11 (×4): qty 1

## 2017-04-11 MED ORDER — LORAZEPAM 1 MG PO TABS
1.0000 mg | ORAL_TABLET | Freq: Once | ORAL | Status: AC
  Administered 2017-04-11: 1 mg via ORAL
  Filled 2017-04-11: qty 1

## 2017-04-11 MED ORDER — ONDANSETRON 4 MG PO TBDP
4.0000 mg | ORAL_TABLET | Freq: Once | ORAL | Status: AC
  Administered 2017-04-11: 4 mg via ORAL
  Filled 2017-04-11: qty 1

## 2017-04-11 MED ORDER — LORAZEPAM 2 MG/ML IJ SOLN: 1.0000 mg | Freq: Once | INTRAMUSCULAR | Status: DC

## 2017-04-11 MED ORDER — MIRTAZAPINE 7.5 MG PO TABS
7.5000 mg | ORAL_TABLET | Freq: Every day | ORAL | Status: DC
  Administered 2017-04-11 – 2017-04-17 (×7): 7.5 mg via ORAL
  Filled 2017-04-11: qty 7
  Filled 2017-04-11 (×9): qty 1

## 2017-04-11 MED ORDER — PROMETHAZINE HCL 25 MG PO TABS
12.5000 mg | ORAL_TABLET | Freq: Three times a day (TID) | ORAL | Status: DC | PRN
  Administered 2017-04-11 – 2017-04-12 (×2): 12.5 mg via ORAL
  Filled 2017-04-11 (×2): qty 1

## 2017-04-11 MED ORDER — ONDANSETRON HCL 4 MG/2ML IJ SOLN: 4.0000 mg | Freq: Once | INTRAMUSCULAR | Status: DC

## 2017-04-11 MED ORDER — POTASSIUM CHLORIDE CRYS ER 20 MEQ PO TBCR: 20.0000 meq | EXTENDED_RELEASE_TABLET | Freq: Two times a day (BID) | ORAL | 0 refills | Status: DC

## 2017-04-11 MED ORDER — POTASSIUM CHLORIDE CRYS ER 20 MEQ PO TBCR
30.0000 meq | EXTENDED_RELEASE_TABLET | Freq: Once | ORAL | Status: AC
  Administered 2017-04-11: 17:00:00 30 meq via ORAL
  Filled 2017-04-11 (×2): qty 1
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
## 2017-04-11 NOTE — Discharge Instructions (Signed)
Please read attached information. If you experience any new or worsening signs or symptoms please return to the emergency room for evaluation. Please follow-up with your primary care provider or specialist as discussed. Please use medication prescribed only as directed and discontinue taking if you have any concerning signs or symptoms.   °

## 2017-04-11 NOTE — BH Assessment (Signed)
[facility] Assessment Progress Note  Per [name], NP, this pt requires psychiatric hospitalization at this time.  [name], RN, [id number].  Pt has signed Voluntary Admission and Consent for Treatment, as well as Consent to Release Information to no one, and signed forms [facility].  Pt's nurse has been notified, and agrees to send original paperwork along with pt via [name], and to call report to [phone] when the time comes.  [name], MA Triage Specialist [phone]

## 2017-04-11 NOTE — ED Triage Notes (Signed)
Patient went to  [facility] and was told to come to the ED to be medically cleared. Patient states she has been having panic attacks x 1 week. Patient denies SI/HI.

## 2017-04-11 NOTE — ED Notes (Signed)
Bed: [facility] Expected date:  Expected time:  Means of arrival:  Comments: 

## 2017-04-11 NOTE — ED Notes (Signed)
Pt given a Sprite and tolerating it well.   Pt updated on plan of care.

## 2017-04-11 NOTE — H&P (Signed)
Behavioral Health Medical Screening Exam  Stacey [facility] is an 21 y.o. female with a past history of anxiety, PTSD and depression who [facility]/o severe anxiety and panic attacks. Patient reports that she hasn't been able to keep any food down for the past week, have been throwing up and as a result, is having gastroesphagus pain 8/10. Patient currently denies any SI/HI/VAH, was observed to be tremulous with pressured speech. [facility] female friend. Patient said she hasn't been on any medications in a while but had success with ativan and xanax for her anxiety in the past. Patient also reported being on Zoloft for her depression but haven't taken it recently because she has no insurance and no means of obtaining her prescriptions. Patient endorsing depression, anhedonia and poor sleep with several episodes of panic attacks during the day. 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   Total Time spent with patient: 30 minutes  Psychiatric Specialty Exam: Physical Exam  Vitals reviewed. Constitutional: She is oriented to person, place, and time. She appears well-developed and well-nourished.  HENT:  Head: Normocephalic and atraumatic.  Neck: Normal range of motion.  Cardiovascular: Normal rate and regular rhythm.   Respiratory: Effort normal.  GI: Bowel sounds are normal.  Genitourinary:  Genitourinary Comments: Deferred   Musculoskeletal: Normal range of motion.  Neurological: She is alert and oriented to person, place, and time.  Skin: Skin is warm and dry.    ROS  Blood pressure (!) 146/90, pulse 98, temperature 98 F (36.7 C), temperature source Oral, resp. rate 18, last menstrual period 03/20/2017, SpO2 98 %.There is no height or weight on file to calculate BMI.  General Appearance: unremarkable  Eye Contact:  Good  Speech:  Clear  and Coherent and Pressured  Volume:  Increased  Mood:  Anxious, Depressed, Hopeless and helpless  Affect:  Flat and Labile  Thought Process:  Coherent  Orientation:  Full (Time, Place, and Person)  Thought Content:  WDL and Logical  Suicidal Thoughts:  No  Homicidal Thoughts:  No  Memory:  Immediate;   Good Recent;   Good Remote;   Fair  Judgement:  Good  Insight:  Present  Psychomotor Activity:  Normal  Concentration: Concentration: Good and Attention Span: Good  Recall:  Good  Fund of Knowledge:Good  Language: Good  Akathisia:  Negative  Handed:  Right  AIMS (if indicated):     Assets:  Communication Skills Desire for Improvement Intimacy Leisure Time Resilience  Sleep:       Musculoskeletal: Strength & Muscle Tone: within normal limits Gait & Station: normal Patient leans: N/A  Blood pressure (!) 146/90, pulse 98, temperature 98 F (36.7 C), temperature source Oral, resp. rate 18, last menstrual period 03/20/2017, SpO2 98 %.  Recommendations:  Based on my evaluation the patient appears to have an emergency medical condition for which I recommend the patient be transferred to the emergency department for further evaluation.  [facility] for medication management and stabilization once medically cleared.  Delila PereyraJustina A Okonkwo, NP 04/11/2017, 1:32 PM
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
## 2017-04-12 ENCOUNTER — Encounter (HOSPITAL_COMMUNITY): Payer: Self-pay | Admitting: *Deleted

## 2017-04-12 DIAGNOSIS — F410 Panic disorder [episodic paroxysmal anxiety] without agoraphobia: Secondary | ICD-10-CM

## 2017-04-12 DIAGNOSIS — Z813 Family history of other psychoactive substance abuse and dependence: Secondary | ICD-10-CM

## 2017-04-12 DIAGNOSIS — F411 Generalized anxiety disorder: Principal | ICD-10-CM

## 2017-04-12 LAB — BASIC METABOLIC PANEL
ANION GAP: 11 (ref 5–15)
BUN: 8 mg/dL (ref 6–20)
CHLORIDE: 97 mmol/L — AB (ref 101–111)
CO2: 28 mmol/L (ref 22–32)
Calcium: 9.4 mg/dL (ref 8.9–10.3)
Creatinine, Ser: 0.74 mg/dL (ref 0.44–1.00)
GFR calc Af Amer: >60 mL/min (ref >60)
GLUCOSE: 135 mg/dL — AB (ref 65–99)
POTASSIUM: 2.9 mmol/L — AB (ref 3.5–5.1)
Sodium: 136 mmol/L (ref 135–145)

## 2017-04-12 MED ORDER — ONDANSETRON HCL 4 MG PO TABS
8.0000 mg | ORAL_TABLET | Freq: Three times a day (TID) | ORAL | Status: DC | PRN
  Administered 2017-04-12 – 2017-04-14 (×3): 8 mg via ORAL
  Filled 2017-04-12 (×4): qty 2

## 2017-04-12 MED ORDER — ESCITALOPRAM OXALATE 5 MG PO TABS
5.0000 mg | ORAL_TABLET | Freq: Every day | ORAL | Status: DC
  Administered 2017-04-12 – 2017-04-14 (×3): 5 mg via ORAL
  Filled 2017-04-12 (×4): qty 1

## 2017-04-12 MED ORDER — POTASSIUM CHLORIDE CRYS ER 20 MEQ PO TBCR
20.0000 meq | EXTENDED_RELEASE_TABLET | Freq: Two times a day (BID) | ORAL | Status: AC
  Administered 2017-04-12: 20 meq via ORAL
  Filled 2017-04-12 (×4): qty 1

## 2017-04-12 NOTE — Progress Notes (Signed)
Adult Psychoeducational Group Note  Date:  04/12/2017 Time:  12:01 AM  Group Topic/Focus:  Wrap-Up Group:   The focus of this group is to help patients review their daily goal of treatment and discuss progress on daily workbooks.  Participation Level:  Minimal  Participation Quality:  Appropriate and Attentive  Affect:  Appropriate  Cognitive:  Appropriate  Insight: Improving  Engagement in Group:  Developing/Improving  Modes of Intervention:  Discussion, Socialization and Support  Additional Comments:  Pt did not want to share in group, pt appeared to shy and timid during group.   Karleen HampshireFox, Jesilyn Easom Brittini 04/12/2017, 12:01 AM

## 2017-04-12 NOTE — [facility] Group Notes (Signed)
[facility] LCSW Group Therapy 04/12/2017 1:15 PM  Type of Therapy: Group Therapy- Feelings about Diagnosis  Participation Level: Pt invited, did not attend.  Jonathon JordanLynn B Yamilette Garretson, MSW, LCSWA [phone] 04/12/2017 4:34 PM
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
## 2017-04-12 NOTE — Progress Notes (Signed)
D: Patient reports nausea and bouts of emesis.  Patient states, "I do this when I'm anxious.  Can I have another dose of phenergan?"  Patient was informed that it was too early and has since been discontinued.  Patient asked if she could get "an injection."  Informed patient that she has zofran ordered 8 mg. Patient's goal today is to "keep my anxiety levels low and stable."  Patient rates her depression and hopelessness as a 5; anxiety as a 7.  She denies any thoughts of self harm.   A: Continue to monitor medication management and MD orders.  Safety checks completed every 15 minutes per protocol.  Offer support and encouragement as needed. R: Patient is receptive to staff; her behavior is appropriate.

## 2017-04-12 NOTE — Progress Notes (Signed)
Recreation Therapy Notes  Animal-Assisted Activity (AAA) Program Checklist/Progress Notes Patient Eligibility Criteria Checklist & Daily Group note for Rec TxIntervention  Date: 04/12/2017 Time: 2:55pm Location: 400 hall dayroom  AAA/T Program Assumption of Risk Form signed by Patient/ or Parent Legal Guardian Yes  Patient is free of allergies or sever asthma Yes  Patient reports no fear of animals Yes  Patient reports no history of cruelty to animals Yes  Patient understands his/her participation is voluntary Yes  Patient washes hands before animal contact Yes  Patient washes hands after animal contact Yes  Behavioral Response: engaged  Education:Hand Washing, Appropriate Animal Interaction   Education Outcome: Acknowledges education.   Clinical Observations/Feedback: Patient attended session and interacted appropriately with therapy dog and peers. Patient asked appropriate questions about therapy dog and his training.    Milica Gully, Recreation Therapy Intern  

## 2017-04-12 NOTE — Tx Team (Signed)
Interdisciplinary Treatment and Diagnostic Plan Update  04/12/2017 Time of Session: 9:30am Stacey Russell MRN: [id number]  Principal Diagnosis: GAD, Panic Disorder   Secondary Diagnoses: Active Problems:   GAD (generalized anxiety disorder)   Current Medications:  Current Facility-Administered Medications  Medication Dose Route Frequency Provider Last Rate Last Dose  . acetaminophen (TYLENOL) tablet 650 mg  650 mg Oral Q6H PRN Okonkwo, Justina A, NP   650 mg at 04/11/17 2105  . alum & mag hydroxide-simeth (MAALOX/MYLANTA) 200-200-20 MG/5ML suspension 30 mL  30 mL Oral Q4H PRN Okonkwo, Justina A, NP   30 mL at 04/12/17 0646  . magnesium hydroxide (MILK OF MAGNESIA) suspension 30 mL  30 mL Oral Daily PRN Okonkwo, Justina A, NP      . mirtazapine (REMERON) tablet 7.5 mg  7.5 mg Oral QHS Okonkwo, Justina A, NP   7.5 mg at 04/11/17 2105  . promethazine (PHENERGAN) tablet 12.5 mg  12.5 mg Oral Q8H PRN Okonkwo, Justina A, NP   12.5 mg at 04/12/17 0646  . sertraline (ZOLOFT) tablet 25 mg  25 mg Oral Daily Okonkwo, Justina A, NP   25 mg at 04/12/17 4827    PTA Medications: Prescriptions Prior to Admission  Medication Sig Dispense Refill Last Dose  . feeding supplement (BOOST / RESOURCE BREEZE) LIQD Take 1 Container by mouth 3 (three) times daily between meals. (Patient not taking: Reported on 04/04/2017)  0 Completed Course at Unknown time  . hydrOXYzine (ATARAX/VISTARIL) 25 MG tablet Take 1 tablet (25 mg total) by mouth every 6 (six) hours. (Patient not taking: Reported on 04/11/2017) 12 tablet 0 Completed Course at Unknown time  . hydrOXYzine (ATARAX/VISTARIL) 25 MG tablet Take 1 tablet (25 mg total) by mouth every 6 (six) hours. (Patient not taking: Reported on 04/11/2017) 12 tablet 0 Completed Course at Unknown time  . mirtazapine (REMERON) 7.5 MG tablet Take 1 tablet (7.5 mg total) by mouth at bedtime. (Patient not taking: Reported on 04/04/2017) 30 tablet 0 Completed Course at Unknown time  .  potassium chloride SA (K-DUR,KLOR-CON) 20 MEQ tablet Take 1 tablet (20 mEq total) by mouth 2 (two) times daily. 10 tablet 0   . promethazine (PHENERGAN) 12.5 MG tablet Take 1 tablet (12.5 mg total) by mouth every 8 (eight) hours as needed for nausea or vomiting. (Patient not taking: Reported on 04/04/2017) 20 tablet 0 Completed Course at Unknown time    Treatment Modalities: Medication Management, Group therapy, Case management,  1 to 1 session with clinician, Psychoeducation, Recreational therapy.  Patient Stressors: Financial difficulties Loss of mother (Mother moved out of state) Occupational concerns Traumatic event  Patient Strengths: Ability for insight Average or above average intelligence Capable of independent living Agricultural engineer for treatment/growth  Physician Treatment Plan for Primary Diagnosis: GAD, Panic Disorder  Long Term Goal(s): Improvement in symptoms so as ready for discharge  Short Term Goals: Ability to verbalize feelings will improve Ability to disclose and discuss suicidal ideas Ability to demonstrate self-control will improve Ability to identify and develop effective coping behaviors will improve Ability to maintain clinical measurements within normal limits will improve Ability to verbalize feelings will improve Ability to disclose and discuss suicidal ideas Ability to demonstrate self-control will improve Ability to identify and develop effective coping behaviors will improve Ability to maintain clinical measurements within normal limits will improve  Medication Management: Evaluate patient's response, side effects, and tolerance of medication regimen.  Therapeutic Interventions: 1 to 1 sessions, Unit Group sessions and Medication administration.  Evaluation  of Outcomes: Not Met  Physician Treatment Plan for Secondary Diagnosis: Active Problems:   GAD (generalized anxiety disorder)   Long Term Goal(s): Improvement in symptoms so as  ready for discharge  Short Term Goals: Ability to verbalize feelings will improve Ability to disclose and discuss suicidal ideas Ability to demonstrate self-control will improve Ability to identify and develop effective coping behaviors will improve Ability to maintain clinical measurements within normal limits will improve Ability to verbalize feelings will improve Ability to disclose and discuss suicidal ideas Ability to demonstrate self-control will improve Ability to identify and develop effective coping behaviors will improve Ability to maintain clinical measurements within normal limits will improve  Medication Management: Evaluate patient's response, side effects, and tolerance of medication regimen.  Therapeutic Interventions: 1 to 1 sessions, Unit Group sessions and Medication administration.  Evaluation of Outcomes: Not Met   RN Treatment Plan for Primary Diagnosis: GAD, Panic Disorder  Long Term Goal(s): Knowledge of disease and therapeutic regimen to maintain health will improve  Short Term Goals: Ability to demonstrate self-control, Ability to verbalize feelings will improve, Ability to disclose and discuss suicidal ideas and Ability to identify and develop effective coping behaviors will improve  Medication Management: RN will administer medications as ordered by provider, will assess and evaluate patient's response and provide education to patient for prescribed medication. RN will report any adverse and/or side effects to prescribing provider.  Therapeutic Interventions: 1 on 1 counseling sessions, Psychoeducation, Medication administration, Evaluate responses to treatment, Monitor vital signs and CBGs as ordered, Perform/monitor CIWA, COWS, AIMS and Fall Risk screenings as ordered, Perform wound care treatments as ordered.  Evaluation of Outcomes: Not Met   LCSW Treatment Plan for Primary Diagnosis: GAD, Panic Disorder  Long Term Goal(s): Safe transition to  appropriate next level of care at discharge, Engage patient in therapeutic group addressing interpersonal concerns.  Short Term Goals: Engage patient in aftercare planning with referrals and resources, Identify triggers associated with mental health/substance abuse issues and Increase skills for wellness and recovery  Therapeutic Interventions: Assess for all discharge needs, 1 to 1 time with Social worker, Explore available resources and support systems, Assess for adequacy in community support network, Educate family and significant other(s) on suicide prevention, Complete Psychosocial Assessment, Interpersonal group therapy.  Evaluation of Outcomes: Not Met   Progress in Treatment: Attending groups: Pt is new to milieu, continuing to assess  Participating in groups: Pt is new to milieu, continuing to assess  Taking medication as prescribed: Yes, MD continues to assess for medication changes as needed Toleration medication: Yes, no side effects reported at this time Family/Significant other contact made: No, CSW attempting to make contact with friend Patient understands diagnosis: Continuing to assess Discussing patient identified problems/goals with staff: Yes Medical problems stabilized or resolved: Yes Denies suicidal/homicidal ideation: Yes Issues/concerns per patient self-inventory: None Other: N/A  New problem(s) identified: None identified at this time.   New Short Term/Long Term Goal(s): None identified at this time.   Discharge Plan or Barriers: Pt will return home and follow-up with outpatient services at [facility]  Reason for Continuation of Hospitalization: Anxiety Depression Medication stabilization  Estimated Length of Stay: 3-5 days  Attendees: Patient: 04/12/2017  9:13 AM  Physician: Dr. Parke Poisson 04/12/2017  9:13 AM  Nursing: Jinny Sanders RN; Santiago Glad, RN 04/12/2017  9:13 AM  RN Care Manager: Lars Pinks, RN 04/12/2017  9:13 AM  Social Worker: Adriana Reams, LCSW; Matthew Saras, LCSWA 04/12/2017  9:13 AM  Recreational Therapist:  04/12/2017  9:13 AM  Other: Lindell Spar, NP 04/12/2017  9:13 AM  Other:  04/12/2017  9:13 AM  Other: 04/12/2017  9:13 AM    Scribe for Treatment Team: Gladstone Lighter, LCSW 04/12/2017 9:13 AM

## 2017-04-12 NOTE — Tx Team (Signed)
Initial Treatment Plan 04/12/2017 1:25 AM Stacey HamburgMorgan Bushart RUE:[id number]    PATIENT STRESSORS: Financial difficulties Loss of mother (Mother moved out of state) Occupational concerns Traumatic event   PATIENT STRENGTHS: Ability for insight Average or above average intelligence Capable of independent living Communication skills Motivation for treatment/growth   PATIENT IDENTIFIED PROBLEMS: Anxiety  "To feel not worried all the time"  "To accept help when I need it or ask for help"                 DISCHARGE CRITERIA:  Ability to meet basic life and health needs Improved stabilization in mood, thinking, and/or behavior Motivation to continue treatment in a less acute level of care Need for constant or close observation no longer present Verbal commitment to aftercare and medication compliance  PRELIMINARY DISCHARGE PLAN: Outpatient therapy Return to previous living arrangement Return to previous work or school arrangements  PATIENT/FAMILY INVOLVEMENT: This treatment plan has been presented to and reviewed with the patient, Stacey HamburgMorgan Russell.  The patient and family have been given the opportunity to ask questions and make suggestions.  Carleene OverlieMiddleton, Armistead Sult P, RN 04/12/2017, 1:25 AM
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
## 2017-04-12 NOTE — [facility] Group Notes (Signed)
[facility] Group Notes:  (Nursing/MHT/Case Management/Adjunct)  Date:  04/12/2017  Time: 0900 am  Type of Therapy:  Psychoeducational Skills  Participation Level:  Minimal  Participation Quality:  Inattentive  Affect:  Resistant  Cognitive:  Lacking  Insight:  Limited  Engagement in Group:  Resistant  Modes of Intervention:  Support  Summary of Progress/Problems: Patient did not stay long.  She states "I need to leave" and left abruptly.  Patient is having difficulty due to nausea/vomiting episodes.  Cranford MonBeaudry, Clova Morlock Evans 04/12/2017, 5:49 PM
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
## 2017-04-13 ENCOUNTER — Encounter (HOSPITAL_COMMUNITY): Payer: Self-pay | Admitting: Nurse Practitioner

## 2017-04-13 LAB — COMPREHENSIVE METABOLIC PANEL
ALBUMIN: 5.3 g/dL — AB (ref 3.5–5.0)
ALK PHOS: 68 U/L (ref 38–126)
ALT: 97 U/L — AB (ref 14–54)
ANION GAP: 16 — AB (ref 5–15)
AST: 54 U/L — AB (ref 15–41)
BILIRUBIN TOTAL: 1.2 mg/dL (ref 0.3–1.2)
BUN: 8 mg/dL (ref 6–20)
CO2: 22 mmol/L (ref 22–32)
CREATININE: 0.88 mg/dL (ref 0.44–1.00)
Calcium: 9.7 mg/dL (ref 8.9–10.3)
Chloride: 100 mmol/L — ABNORMAL LOW (ref 101–111)
GFR calc Af Amer: >60 mL/min (ref >60)
GFR calc non Af Amer: >60 mL/min (ref >60)
GLUCOSE: 125 mg/dL — AB (ref 65–99)
Potassium: 2.9 mmol/L — ABNORMAL LOW (ref 3.5–5.1)
Sodium: 138 mmol/L (ref 135–145)
TOTAL PROTEIN: 8.2 g/dL — AB (ref 6.5–8.1)

## 2017-04-13 LAB — URINALYSIS, ROUTINE W REFLEX MICROSCOPIC
Bilirubin Urine: NEGATIVE
GLUCOSE, UA: NEGATIVE mg/dL
HGB URINE DIPSTICK: NEGATIVE
Ketones, ur: 20 mg/dL — AB
NITRITE: NEGATIVE
Protein, ur: NEGATIVE mg/dL
SPECIFIC GRAVITY, URINE: 1.02 (ref 1.005–1.030)
pH: 8 (ref 5.0–8.0)

## 2017-04-13 LAB — CBC
HCT: 39.9 % (ref 36.0–46.0)
Hemoglobin: 14.4 g/dL (ref 12.0–15.0)
MCH: 29.1 pg (ref 26.0–34.0)
MCHC: 36.1 g/dL — AB (ref 30.0–36.0)
MCV: 80.8 fL (ref 78.0–100.0)
PLATELETS: 419 10*3/uL — AB (ref 150–400)
RBC: 4.94 MIL/uL (ref 3.87–5.11)
RDW: 12.6 % (ref 11.5–15.5)
WBC: 14.1 10*3/uL — ABNORMAL HIGH (ref 4.0–10.5)

## 2017-04-13 LAB — POTASSIUM: POTASSIUM: 3.7 mmol/L (ref 3.5–5.1)

## 2017-04-13 LAB — LIPASE, BLOOD: Lipase: 26 U/L (ref 11–51)

## 2017-04-13 MED ORDER — ONDANSETRON HCL 4 MG/2ML IJ SOLN
4.0000 mg | Freq: Once | INTRAMUSCULAR | Status: AC
  Administered 2017-04-13: 4 mg via INTRAVENOUS
  Filled 2017-04-13: qty 2

## 2017-04-13 MED ORDER — POTASSIUM CHLORIDE CRYS ER 20 MEQ PO TBCR
40.0000 meq | EXTENDED_RELEASE_TABLET | Freq: Once | ORAL | Status: DC
  Filled 2017-04-13: qty 2

## 2017-04-13 MED ORDER — POTASSIUM CHLORIDE 10 MEQ/100ML IV SOLN
10.0000 meq | Freq: Once | INTRAVENOUS | Status: AC
  Administered 2017-04-13: 10 meq via INTRAVENOUS
  Filled 2017-04-13: qty 100

## 2017-04-13 MED ORDER — FAMOTIDINE IN NACL 20-0.9 MG/50ML-% IV SOLN
20.0000 mg | Freq: Once | INTRAVENOUS | Status: AC
  Administered 2017-04-13: 20 mg via INTRAVENOUS
  Filled 2017-04-13: qty 50

## 2017-04-13 MED ORDER — POTASSIUM CHLORIDE 20 MEQ/15ML (10%) PO SOLN
40.0000 meq | Freq: Once | ORAL | Status: AC
  Administered 2017-04-13: 40 meq via ORAL
  Filled 2017-04-13: qty 30

## 2017-04-13 MED ORDER — POTASSIUM CHLORIDE IN NACL 20-0.9 MEQ/L-% IV SOLN
Freq: Once | INTRAVENOUS | Status: AC
  Administered 2017-04-13: 12:00:00 via INTRAVENOUS
  Filled 2017-04-13: qty 1000

## 2017-04-13 MED ORDER — PROMETHAZINE HCL 25 MG/ML IJ SOLN
25.0000 mg | Freq: Once | INTRAMUSCULAR | Status: DC
  Filled 2017-04-13: qty 1

## 2017-04-13 MED ORDER — PROMETHAZINE HCL 25 MG/ML IJ SOLN
25.0000 mg | Freq: Once | INTRAMUSCULAR | Status: AC
  Administered 2017-04-13: 25 mg via INTRAVENOUS

## 2017-04-13 MED ORDER — PANTOPRAZOLE SODIUM 40 MG IV SOLR
40.0000 mg | Freq: Once | INTRAVENOUS | Status: AC
  Administered 2017-04-13: 40 mg via INTRAVENOUS
  Filled 2017-04-13: qty 40

## 2017-04-13 NOTE — ED Notes (Signed)
Bed: [id number] Expected date:  Expected time:  Means of arrival:  Comments: EMS-from [facility]-abdominal pain

## 2017-04-13 NOTE — ED Triage Notes (Signed)
[facility] having epig[facility] pain for several days and today has been the worst day. Patient vomited once no diarrhea. Patient started a new medication lexapro yesterday. [facility] and was treated for low potassium.

## 2017-04-13 NOTE — Progress Notes (Signed)
Adult Psychoeducational Group Note  Date:  04/13/2017 Time:  5:27 AM  Group Topic/Focus:  Wrap-Up Group:   The focus of this group is to help patients review their daily goal of treatment and discuss progress on daily workbooks.  Participation Level:  Active  Participation Quality:  Appropriate, Attentive, Sharing and Supportive  Affect:  Appropriate  Cognitive:  Appropriate  Insight: Appropriate and Good  Engagement in Group:  Developing/Improving  Modes of Intervention:  Discussion, Socialization and Support  Additional Comments:  Pt shared during group her goal was to work on anxiety and get out of comfort. Pt stated she has felt " good" today and happy she is here.  Karleen HampshireFox, Dwayna Kentner Brittini 04/13/2017, 5:27 AM

## 2017-04-13 NOTE — ED Notes (Signed)
Per Rayfield Citizenaroline, RN states patient's PO Phenergan was discontinued-states patient has been requesting IM Phenergan for her abdominal pain-sending her to be evaluated-

## 2017-04-13 NOTE — ED Notes (Signed)
Pt began vomiting on floor and in trash. Pt states "the sensation of eating and drinking makes want to vomit".
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
## 2017-04-13 NOTE — Progress Notes (Signed)
Patient has been complaining of bouts of vomiting.  Staff has observed one episode of green emesis in the toilet.  Patient rates her pain a 10/10.  She is complaining of severe abdominal pain radiating toward her back area.  MD assessed patient at bedside and [facility].  Called charge nurse to give report.  EMS called due to patient's physical condition and inability to walk without assistance.  Patient has been focused on getting an injection of phenergan since yesterday.  She was taking 12.5 mg po of phenergan which MD discontinued.  She has had 8 mg zofran around 0600 this morning.  Patient states, "I do this every morning.  I attempted to eat a banana and some apple juice.  It usually calms down after a while."  Patient states this is 50% physical and 50% anxiety.  AC notified also of situation before EMS was called.  Order from MD present to send patient to ED.

## 2017-04-13 NOTE — Progress Notes (Signed)
Stacey Russell. Stacey Russell had been up and visible in milieu this evening, did attend and participate in evening group activity. She spoke of her day and spoke about feeling better and spoke about on-going anxiety issues and having nausea and vomiting because of it and spoke about learning how to cope positively with her anxiety. She was able to receive bedtime medication without incident and did not verbalize any complaints of pain. A. Support and encouragement provided. R. Safety maintained, will continue to monitor.
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
## 2017-04-14 MED ORDER — PANTOPRAZOLE SODIUM 40 MG PO TBEC
40.0000 mg | DELAYED_RELEASE_TABLET | Freq: Every day | ORAL | Status: DC
  Administered 2017-04-15 – 2017-04-17 (×3): 40 mg via ORAL
  Filled 2017-04-14: qty 1
  Filled 2017-04-14: qty 7
  Filled 2017-04-14 (×4): qty 1

## 2017-04-14 MED ORDER — BOOST / RESOURCE BREEZE PO LIQD
1.0000 | Freq: Three times a day (TID) | ORAL | Status: DC
  Administered 2017-04-14 – 2017-04-15 (×3): 1 via ORAL
  Filled 2017-04-14 (×16): qty 1

## 2017-04-14 MED ORDER — ESCITALOPRAM OXALATE 5 MG PO TABS
5.0000 mg | ORAL_TABLET | Freq: Every day | ORAL | Status: DC
  Administered 2017-04-15 – 2017-04-16 (×2): 5 mg via ORAL
  Filled 2017-04-14 (×5): qty 1

## 2017-04-14 NOTE — Progress Notes (Signed)
Stacey Russell. Stacey Russell had been up and visible in milieu this evening, attended and participated in group activity. She shared that she had to go to the hospital because of nausea and vomiting and spoke about her anxiety and how it took her 8 hours for the anxiety to go away and for her to start feeling better. She was able to receive bedtime medication without incident and did not verbalize any complaints of pain. A. Support and encouragement provided. R. Safety maintained, will continue to monitor.

## 2017-04-14 NOTE — Progress Notes (Signed)
Pt did not attend group. 

## 2017-04-14 NOTE — Progress Notes (Addendum)
D: Patient met with treatment team this morning and she agreed to take her medication later in the day due to vomiting/nausea in the morning.  Patient has drank boost X1.  She has been down for meals.  She has been observed in the day room playing games and joking around with her peers.  She stated this morning that "I worked through my anxiety."  Patient was given her 1800 medications and she states, "I feel nauseous.  I need some zofran."  Zofran was pulled from the pyxis.  Patient asked, "where is my injection?"  I explained to patient that she did not have an injection ordered and she immediately stated, "well, I can't take that.  I am going to go throw up now."  Documented that patient refused her medications.  Patient has been focused on getting injections. Patient denies any thoughts of self harm. A: Continue to monitor medication management and MD orders.  Safety checks completed every 15 minutes per protocol.  Offer support and encouragement as needed. R: Patient is receptive to staff; her behavior is appropriate.

## 2017-04-14 NOTE — Progress Notes (Signed)
D: Patient has had one bout of emesis this morning. She reports that it usually happens that time of day.  She does not appear to be in any distress and has handled her anxiety well today. Patient has taken two hot baths, which she states helps with her anxiety.  Patient was receptive to drinking "boost juice" for extra nutrients.  Patient was also receptive to the idea of taking her medications later in the day "so I won't throw them up in the morning."  Patient states that she feels her "excessive pot smoking" may be contributing to the extra anxiety and the nausea.  She states, "I think I've been smoking too much weed."  Patient K+ has improved to 3.7.  She denies any thoughts of self harm.  She feels she will be ready for discharge on Monday.   A: Continue to monitor medication management and MD orders.  Safety checks completed every 15 minutes per protocol.  Offer support and encouragement. R: Patient is receptive to staff; her behavior is appropriate.
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
## 2017-04-14 NOTE — [facility] Group Notes (Signed)
[facility] LCSW Group Therapy 04/14/2017 1:15pm  Type of Therapy: Group Therapy- Balance in Life  Participation Level: Active   Description of the Group:  The topic for group was balance in life. Today's group focused on defining balance in one's own words, identifying things that can knock one off balance, and exploring healthy ways to maintain balance in life. Group members were asked to provide an example of a time when they felt off balance, describe how they handled that situation,and process healthier ways to regain balance in the future. Group members were asked to share the most important tool for maintaining balance that they [facility] and how they plan to apply this method after discharge.  Summary of Patient Progress Pt identified knowing that her life is off balance based on her increase in isolation. Pt expresses that she is aware of this habit and endorses that it is because she does not want others to see her when she is emotionally vulnerable.     Therapeutic Modalities:   Cognitive Behavioral Therapy Solution-Focused Therapy Assertiveness Training   Stacey ShanksLauren Herson Prichard, LCSW 04/14/2017 3:31 PM

## 2017-04-15 LAB — BASIC METABOLIC PANEL
Anion gap: 13 (ref 5–15)
BUN: 8 mg/dL (ref 6–20)
CHLORIDE: 101 mmol/L (ref 101–111)
CO2: 23 mmol/L (ref 22–32)
Calcium: 9.4 mg/dL (ref 8.9–10.3)
Creatinine, Ser: 0.76 mg/dL (ref 0.44–1.00)
GFR calc Af Amer: >60 mL/min (ref >60)
GFR calc non Af Amer: >60 mL/min (ref >60)
GLUCOSE: 121 mg/dL — AB (ref 65–99)
POTASSIUM: 3 mmol/L — AB (ref 3.5–5.1)
Sodium: 137 mmol/L (ref 135–145)

## 2017-04-15 MED ORDER — HYDROXYZINE HCL 25 MG PO TABS
25.0000 mg | ORAL_TABLET | Freq: Once | ORAL | Status: AC
  Administered 2017-04-15: 25 mg via ORAL

## 2017-04-15 MED ORDER — HYDROXYZINE HCL 50 MG PO TABS: 50.0000 mg | ORAL_TABLET | Freq: Four times a day (QID) | ORAL | Status: DC | PRN

## 2017-04-15 MED ORDER — ONDANSETRON HCL 4 MG/2ML IJ SOLN
4.0000 mg | Freq: Once | INTRAMUSCULAR | Status: AC
  Administered 2017-04-15: 4 mg via INTRAMUSCULAR
  Filled 2017-04-15 (×2): qty 2

## 2017-04-15 MED ORDER — HYDROXYZINE HCL 25 MG PO TABS
25.0000 mg | ORAL_TABLET | Freq: Four times a day (QID) | ORAL | Status: DC | PRN
  Administered 2017-04-15: 25 mg via ORAL

## 2017-04-15 MED ORDER — HYDROXYZINE HCL 50 MG PO TABS
50.0000 mg | ORAL_TABLET | Freq: Three times a day (TID) | ORAL | Status: DC | PRN
Start: 2017-04-15 — End: 2017-04-18
  Administered 2017-04-16 – 2017-04-17 (×4): 50 mg via ORAL
  Filled 2017-04-15 (×3): qty 1
  Filled 2017-04-15: qty 10
  Filled 2017-04-15: qty 1

## 2017-04-15 MED ORDER — POTASSIUM CHLORIDE CRYS ER 20 MEQ PO TBCR
20.0000 meq | EXTENDED_RELEASE_TABLET | Freq: Two times a day (BID) | ORAL | Status: AC
  Administered 2017-04-15 – 2017-04-16 (×3): 20 meq via ORAL
  Filled 2017-04-15 (×4): qty 1

## 2017-04-15 MED ORDER — POTASSIUM CHLORIDE CRYS ER 20 MEQ PO TBCR: 20.0000 meq | EXTENDED_RELEASE_TABLET | Freq: Two times a day (BID) | ORAL | Status: DC

## 2017-04-15 MED ORDER — ONDANSETRON 4 MG PO TBDP
8.0000 mg | ORAL_TABLET | Freq: Three times a day (TID) | ORAL | Status: DC | PRN
  Administered 2017-04-15 – 2017-04-17 (×3): 8 mg via ORAL
  Filled 2017-04-15 (×3): qty 2

## 2017-04-15 MED ORDER — POTASSIUM CHLORIDE CRYS ER 10 MEQ PO TBCR
EXTENDED_RELEASE_TABLET | ORAL | Status: AC
  Filled 2017-04-15: qty 2

## 2017-04-15 MED ORDER — HYDROXYZINE HCL 25 MG PO TABS
ORAL_TABLET | ORAL | Status: AC
  Filled 2017-04-15: qty 1

## 2017-04-15 NOTE — Tx Team (Signed)
Interdisciplinary Treatment and Diagnostic Plan Update  04/15/2017 Time of Session: 9:30am Earney HamburgMorgan Sharpless MRN: [id number]  Principal Diagnosis: GAD, Panic Disorder   Secondary Diagnoses: Active Problems:   GAD (generalized anxiety disorder)   Current Medications:  Current Facility-Administered Medications  Medication Dose Route Frequency Provider Last Rate Last Dose  . acetaminophen (TYLENOL) tablet 650 mg  650 mg Oral Q6H PRN Okonkwo, Justina A, NP   650 mg at 04/11/17 2105  . alum & mag hydroxide-simeth (MAALOX/MYLANTA) 200-200-20 MG/5ML suspension 30 mL  30 mL Oral Q4H PRN Okonkwo, Justina A, NP   30 mL at 04/12/17 0646  . escitalopram (LEXAPRO) tablet 5 mg  5 mg Oral Daily Cobos, Fernando A, MD      . feeding supplement (BOOST / RESOURCE BREEZE) liquid 1 Container  1 Container Oral TID BM Cobos, Rockey SituFernando A, MD   1 Container at 04/15/17 [phone]  . magnesium hydroxide (MILK OF MAGNESIA) suspension 30 mL  30 mL Oral Daily PRN Okonkwo, Justina A, NP      . mirtazapine (REMERON) tablet 7.5 mg  7.5 mg Oral QHS Okonkwo, Justina A, NP   7.5 mg at 04/14/17 2308  . ondansetron (ZOFRAN) tablet 8 mg  8 mg Oral Q8H PRN Cobos, Rockey SituFernando A, MD   8 mg at 04/14/17 0611  . pantoprazole (PROTONIX) EC tablet 40 mg  40 mg Oral Daily Cobos, Rockey SituFernando A, MD        PTA Medications: Prescriptions Prior to Admission  Medication Sig Dispense Refill Last Dose  . feeding supplement (BOOST / RESOURCE BREEZE) LIQD Take 1 Container by mouth 3 (three) times daily between meals. (Patient not taking: Reported on 04/04/2017)  0 Completed Course at Unknown time  . hydrOXYzine (ATARAX/VISTARIL) 25 MG tablet Take 1 tablet (25 mg total) by mouth every 6 (six) hours. (Patient not taking: Reported on 04/11/2017) 12 tablet 0 Completed Course at Unknown time  . hydrOXYzine (ATARAX/VISTARIL) 25 MG tablet Take 1 tablet (25 mg total) by mouth every 6 (six) hours. (Patient not taking: Reported on 04/11/2017) 12 tablet 0 Completed Course  at Unknown time  . mirtazapine (REMERON) 7.5 MG tablet Take 1 tablet (7.5 mg total) by mouth at bedtime. (Patient not taking: Reported on 04/04/2017) 30 tablet 0 Completed Course at Unknown time  . potassium chloride SA (K-DUR,KLOR-CON) 20 MEQ tablet Take 1 tablet (20 mEq total) by mouth 2 (two) times daily. 10 tablet 0   . promethazine (PHENERGAN) 12.5 MG tablet Take 1 tablet (12.5 mg total) by mouth every 8 (eight) hours as needed for nausea or vomiting. (Patient not taking: Reported on 04/04/2017) 20 tablet 0 Completed Course at Unknown time    Treatment Modalities: Medication Management, Group therapy, Case management,  1 to 1 session with clinician, Psychoeducation, Recreational therapy.  Patient Stressors: Financial difficulties Loss of mother (Mother moved out of state) Occupational concerns Traumatic event  Patient Strengths: Ability for insight Average or above average intelligence Capable of independent living Barrister's clerkCommunication skills Motivation for treatment/growth  Physician Treatment Plan for Primary Diagnosis: GAD, Panic Disorder  Long Term Goal(s): Improvement in symptoms so as ready for discharge  Short Term Goals: Ability to verbalize feelings will improve Ability to disclose and discuss suicidal ideas Ability to demonstrate self-control will improve Ability to identify and develop effective coping behaviors will improve Ability to maintain clinical measurements within normal limits will improve Ability to verbalize feelings will improve Ability to disclose and discuss suicidal ideas Ability to demonstrate self-control will improve Ability to  identify and develop effective coping behaviors will improve Ability to maintain clinical measurements within normal limits will improve  Medication Management: Evaluate patient's response, side effects, and tolerance of medication regimen.  Therapeutic Interventions: 1 to 1 sessions, Unit Group sessions and Medication  administration.  Evaluation of Outcomes: Progressing  Physician Treatment Plan for Secondary Diagnosis: Active Problems:   GAD (generalized anxiety disorder)   Long Term Goal(s): Improvement in symptoms so as ready for discharge  Short Term Goals: Ability to verbalize feelings will improve Ability to disclose and discuss suicidal ideas Ability to demonstrate self-control will improve Ability to identify and develop effective coping behaviors will improve Ability to maintain clinical measurements within normal limits will improve Ability to verbalize feelings will improve Ability to disclose and discuss suicidal ideas Ability to demonstrate self-control will improve Ability to identify and develop effective coping behaviors will improve Ability to maintain clinical measurements within normal limits will improve  Medication Management: Evaluate patient's response, side effects, and tolerance of medication regimen.  Therapeutic Interventions: 1 to 1 sessions, Unit Group sessions and Medication administration.  Evaluation of Outcomes: Progressing   RN Treatment Plan for Primary Diagnosis: GAD, Panic Disorder  Long Term Goal(s): Knowledge of disease and therapeutic regimen to maintain health will improve  Short Term Goals: Ability to demonstrate self-control, Ability to verbalize feelings will improve, Ability to disclose and discuss suicidal ideas and Ability to identify and develop effective coping behaviors will improve  Medication Management: RN will administer medications as ordered by provider, will assess and evaluate patient's response and provide education to patient for prescribed medication. RN will report any adverse and/or side effects to prescribing provider.  Therapeutic Interventions: 1 on 1 counseling sessions, Psychoeducation, Medication administration, Evaluate responses to treatment, Monitor vital signs and CBGs as ordered, Perform/monitor CIWA, COWS, AIMS and Fall  Risk screenings as ordered, Perform wound care treatments as ordered.  Evaluation of Outcomes: Progressing   LCSW Treatment Plan for Primary Diagnosis: GAD, Panic Disorder  Long Term Goal(s): Safe transition to appropriate next level of care at discharge, Engage patient in therapeutic group addressing interpersonal concerns.  Short Term Goals: Engage patient in aftercare planning with referrals and resources, Identify triggers associated with mental health/substance abuse issues and Increase skills for wellness and recovery  Therapeutic Interventions: Assess for all discharge needs, 1 to 1 time with Social worker, Explore available resources and support systems, Assess for adequacy in community support network, Educate family and significant other(s) on suicide prevention, Complete Psychosocial Assessment, Interpersonal group therapy.  Evaluation of Outcomes: Progressing   Progress in Treatment: Attending groups: Yes Participating in groups: Yes Taking medication as prescribed: Yes, MD continues to assess for medication changes as needed Toleration medication: Yes, no side effects reported at this time Family/Significant other contact made: No, CSW attempting to make contact with friend Patient understands diagnosis: Yes AEB willingness so seek treatment for depression ans anxiety Discussing patient identified problems/goals with staff: Yes Medical problems stabilized or resolved: Yes Denies suicidal/homicidal ideation: Yes Issues/concerns per patient self-inventory: None Other: N/A  New problem(s) identified: None identified at this time.   New Short Term/Long Term Goal(s): None identified at this time.   Discharge Plan or Barriers: Pt will return home and follow-up with [facility]  Reason for Continuation of Hospitalization: Anxiety Depression Medication stabilization  Estimated Length of Stay: Est DC date of 6/18  Attendees: Patient: 04/15/2017  11:38 AM   Physician: Dr. Jama Flavors 04/15/2017  11:38 AM  Nursing:  Stacy Gardner, RN 04/15/2017  11:38 AM  RN Care Manager:  04/15/2017  11:38 AM  Social Worker: Vernie Shanks, LCSW; Jodelle Red, LCSWA 04/15/2017  11:38 AM  Recreational Therapist:  04/15/2017  11:38 AM  Other: Armandina Stammer, NP 04/15/2017  11:38 AM  Other:  04/15/2017  11:38 AM  Other: 04/15/2017  11:38 AM    Scribe for Treatment Team: Verdene Lennert, LCSW 04/15/2017 11:38 AM

## 2017-04-15 NOTE — Progress Notes (Signed)
Nursing Note 04/15/2017 [id number]  Data Reports sleeping poor with PRN sleep med.  Rates depression 10/10, hopelessness 10/10, and anxiety 10/10. Affect blunted.  Deneis HI, SI, AVH.  C/O ongoing nausea, requesting injection for nausea this AM stating "the doctor said I would get one before breakfast."  Received order from MD after breakfast, given to patient.  Patient unable to eat today, drinking fluids however.  Patient had an episode after a bath in the afternoon when she had to be escorted back to the room, wretching, vomited small clear amount in garbage can.  Abdominal tenderness in epigastric region.  Bowel sounds hypoactive throughout.  Small loose BM today and yesterday per patient.  Patient calmed down and was able to take Zofran, Protonix, and tolerate fluids and medicines afterwards.  Action Spoke with patient 1:1, nurse offered support to patient throughout shift.  RN validated patient's feelings, assisted patient with breathing techniques, and had peer support speak with patient.  Continues to be monitored on 15 minute checks for safety.  Response Patient anxiety reduced by evening time.  Unable to eat but drinking plenty of floods.  Blood pressure not orthostatic. Vistaril effective.

## 2017-04-15 NOTE — [facility] Group Notes (Signed)
[facility] LCSW Group Therapy 04/15/2017 1:15pm  Type of Therapy: Group Therapy- Feelings Around Relapse and Recovery  Pt did not attend, declined invitation.   Vernie ShanksLauren Hibo Blasdell, LCSW [phone] 04/15/2017 4:51 PM
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
## 2017-04-15 NOTE — [facility] Suicide Risk Assessment (Signed)
[facility] INPATIENT:  Family/Significant Other Suicide Prevention Education  Suicide Prevention Education:  Education Completed; Zadie RhineVanessa Harris, Pt's friend [phone], has been identified by the patient as the family member/significant other with whom the patient will be residing, and identified as the person(s) who will aid the patient in the event of a mental health crisis (suicidal ideations/suicide attempt).  With written consent from the patient, the family member/significant other has been provided the following suicide prevention education, prior to the and/or following the discharge of the patient.  The suicide prevention education provided includes the following:  Suicide risk factors  Suicide prevention and interventions  National Suicide Hotline telephone number  [facility] telephone number  [facility] Assistance 911  [facility] and/or [facility] telephone number  Request made of family/significant other to:  Remove weapons (e.g., guns, rifles, knives), all items previously/currently identified as safety concern.    Remove drugs/medications (over-the-counter, prescriptions, illicit drugs), all items previously/currently identified as a safety concern.  The family member/significant other verbalizes understanding of the suicide prevention education information provided.  The family member/significant other agrees to remove the items of safety concern listed above.  Verdene LennertLauren C Arul Farabee 04/15/2017, 11:45 AM

## 2017-04-16 DIAGNOSIS — G4700 Insomnia, unspecified: Secondary | ICD-10-CM

## 2017-04-16 DIAGNOSIS — R110 Nausea: Secondary | ICD-10-CM

## 2017-04-16 DIAGNOSIS — F1290 Cannabis use, unspecified, uncomplicated: Secondary | ICD-10-CM

## 2017-04-16 DIAGNOSIS — F329 Major depressive disorder, single episode, unspecified: Secondary | ICD-10-CM

## 2017-04-16 DIAGNOSIS — K219 Gastro-esophageal reflux disease without esophagitis: Secondary | ICD-10-CM

## 2017-04-16 DIAGNOSIS — E876 Hypokalemia: Secondary | ICD-10-CM

## 2017-04-16 NOTE — Progress Notes (Signed)
D: Pt denies SI/HI/AVH. Pt anxious and using coping skills for anxiety.  A: Pt was offered support and encouragement. Pt. Instructed on deep breathing to help her relax.  Pt was given scheduled medications. Pt was encourage to attend groups. Q 15 minute checks were done for safety.  R:Pt attends groups and interacts well with peers and staff. Pt is taking medication. Pt with decreased anxiety. Pt receptive to treatment and safety maintained on unit.

## 2017-04-16 NOTE — [facility] Group Notes (Signed)
Life Skills   Date:  04/16/2017  Time:  1000  Type of Therapy:  Nurse Education  / Identifying needs : The group focuses on teaching patients how to identify their needs as well as how to develop the skills needed to get them met. Participation Level:  Did Not Attend  Participation Quality:    Affect:    Cognitive:    Insight:    Engagement in Group:    Modes of Intervention:    Summary of Progress/Problems:  Stacey Russell 04/16/2017, 12:22 PM

## 2017-04-16 NOTE — [facility] Group Notes (Signed)
[facility] LCSW Group Therapy  04/16/2017   Type of Therapy:  Group Therapy  Participation Level:  Active  Participation Quality:  Appropriate and Attentive  Affect:  Appropriate  Cognitive:  Alert and Oriented  Insight:  Improving  Engagement in Therapy:  Improving  Modes of Intervention:  Discussion  Today's group was about swapping emotions based activities. Patient were able to identify things that they did that lead them to negative emotional states. For example, feeling weak, frustrated, foolish, etc. Patients were then able to express thing they did that lead them to positive emotional states. For example, feeling loved, smart, empowered, etc. Patients then were able to process ways in which they could evoke these positive emotional states when dealing with negative emotional states.   Yasaman Kolek J Fermon Ureta MSW, LCSW 
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
## 2017-04-16 NOTE — Progress Notes (Signed)
[facility] Group Notes:  (Nursing/MHT/Case Management/Adjunct)  Date:  04/16/2017  Time:  11:10 PM  Type of Therapy:  Psychoeducational Skills  Participation Level:  Active  Participation Quality:  Resistant  Affect:  Flat  Cognitive:  Appropriate  Insight:  Appropriate  Engagement in Group:  Resistant  Modes of Intervention:  Education  Summary of Progress/Problems:Patient rated her day as an 8 out of a possible 10. She verbalized that she was grateful for just waking up in the morning and had nothing else to share about her day. In terms of the theme for the day, her coping skill will be to replace negative feelings with positive feelings.   Illyria Sobocinski S 04/16/2017, 11:10 PM

## 2017-04-16 NOTE — Progress Notes (Signed)
Nursing Note 04/16/2017 [id number]  Data Reports sleeping fair with PRN sleep med.  Rates depression 3/10, hopelessness 0/10, and anxiety 5/10. Affect blunted but appropriate.  Denies HI, SI, AVH.  Attending groups.  Slept in this AM.  C/O nausea late morning as well as anxiety.  Patient did not eat any breakfast or lunch but ate a bag of goldfish after lunch time with encouragement.  At a pack of goldfish crackers for snack in afternoon as well.  Did not eat any dinner. Expresses fear of anxiety causing nausea and vomiting if she eats.  Drinking plenty of fluids.   Action Spoke with patient 1:1, nurse offered support to patient throughout shift.  Patient educated on importance of eating regular meals, beginning with smaller more frequent portions.  Continues to be monitored on 15 minute checks for safety.  Response Verbalized understanding of education, patient states she will try to eat more.  Patient status reported to oncoming RN for follow up.  Remains safe on unit.

## 2017-04-17 MED ORDER — ESCITALOPRAM OXALATE 10 MG PO TABS
10.0000 mg | ORAL_TABLET | Freq: Every day | ORAL | Status: DC
  Administered 2017-04-17: 10 mg via ORAL
  Filled 2017-04-17 (×2): qty 1
  Filled 2017-04-17: qty 7
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
## 2017-04-17 NOTE — [facility] Group Notes (Signed)
[facility] LCSW Group Therapy  04/17/2017   Type of Therapy:  Group Therapy  Participation Level:  Active  Participation Quality:  Appropriate and Attentive  Affect:  Appropriate  Cognitive:  Alert and Oriented  Insight:  Improving  Engagement in Therapy:  Improving  Modes of Intervention:  Discussion  Today's group was done using the 'Ungame' in order to develop and express themselves about a variety of topics. Selected cards for this game included identity and relationship. Patients were able to discuss dealing with positive and negative situations, identifying supports and other ways to understand your identity. Patients shared unique viewpoints but often had similar characteristics.  Patients encouraged to use this dialogue to develop goals and supports for future progress.     Stacey Russell J Stacey Russell MSW, LCSW 

## 2017-04-17 NOTE — Progress Notes (Signed)
D: Pt is alert and oriented x 4. Pt at the time of assessment endorsed moderate depression and anxiety; states, "I feel much better than what I was this morning." Pt denied SI, HI, Pain or AVH. Pt observed interacting appropriately with peers. Pt remained calm and cooperative.  A: Medications offered as prescribed. All patient's questions and concerns addressed. Support, encouragement, and safe environment provided. 15-minute safety checks continue. R: Pt was med compliant.  Pt did attend wrap-up group. Safety checks continue

## 2017-04-17 NOTE — Progress Notes (Signed)
D: Patient refuses her potassium pill in the morning stating, "I can't take that."  She requested her zofran and vistaril and was able to swallow them.  Patient is a possible discharge tomorrow.  Patient has not had any vomiting episodes this morning.  She continues to verbalize that she gets "nausious and can't eat."  She rates her abdominal pain as a 3.  She rates her depression as a 2; denies hopelessness and rates her anxiety as a 3.  She states her appetite is "fair."  She denies any thoughts of self harm. A: Continue to monitor medication management and MD orders.  Safety checks completed every 15 minutes per protocol.  Offer support and encouragement as needed. R: Patient is receptive to staff; her behavior is appropriate.

## 2017-04-17 NOTE — [facility] Group Notes (Signed)
Life Skills   Date:  04/17/2017  Time:  1000  Type of Therapy:  Nurse Education /  Healthy Support Systems: The group is focused on teaching patients how to develop healthy support systems that will aide them in their recovery.  Participation Level:  Active  Participation Quality:  Attentive  Affect:  Appropriate  Cognitive:  Appropriate  Insight:  Good  Engagement in Group:  Engaged  Modes of Intervention:  Education  Summary of Progress/Problems:  Rich BraveDuke, Jadesola Poynter Lynn 04/17/2017, 11:47 AM

## 2017-04-18 MED ORDER — ESCITALOPRAM OXALATE 10 MG PO TABS: 10.0000 mg | ORAL_TABLET | Freq: Every day | ORAL | 0 refills | Status: DC

## 2017-04-18 MED ORDER — MIRTAZAPINE 7.5 MG PO TABS: 7.5000 mg | ORAL_TABLET | Freq: Every day | ORAL | 0 refills | Status: DC

## 2017-04-18 MED ORDER — PANTOPRAZOLE SODIUM 40 MG PO TBEC: 40.0000 mg | DELAYED_RELEASE_TABLET | Freq: Every day | ORAL | 0 refills | Status: DC

## 2017-04-18 MED ORDER — HYDROXYZINE HCL 50 MG PO TABS: 50.0000 mg | ORAL_TABLET | Freq: Three times a day (TID) | ORAL | 0 refills | Status: DC | PRN

## 2017-04-18 NOTE — Progress Notes (Signed)
Nursing Discharge Note 04/18/2017 [id number]  Data Reports sleeping poor with PRN sleep med.  Rates depression 2/10, hopelessness 0/10, and anxiety 3/10. Affect wide ranged, bright and animated. Denies HI, SI, AVH.  Attending groups, polite and appropriate.  Did not eat breakfast.  Received discharge orders.  Action Spoke with patient 1:1, nurse offered support to patient throughout shift.  Reviewed medications, discharge instructions, and follow up appointments with patient. Medication samples and scripts reviewed and given to patient.  Paperwork, AVS, SRA, and transition record handed to patient.   Escorted off of unit at 1538. Belongings returned per belongings form.  Discharged to lobby where she was met by family.    Response Verbalized understanding of discharge teaching. Agrees to contact someone or 911 with thoughts/intent to harm self or others.    To follow up per AVS.

## 2017-04-18 NOTE — Progress Notes (Signed)
  [facility] :  Will you be returning to the same living situation after discharge:  Yes,  pt returning home. At discharge, do you have transportation home?: Yes,  pt has access to transportation. Do you have the ability to pay for your medications: Yes,  prescriptions and samples provided.  Release of information consent forms completed and in the chart;  Patient's signature needed at discharge.  Patient to Follow up at: Follow-up Information    [facility] Follow up.   Specialty:  Behavioral Health Why:  Please go for a walk-in appointment within 7 days of discharge to be established for outpatient services. Walk-in hours are Mon-Fri 8a-3p. Please arrive as early as possible to be sure that you are seen. Contact information: [address] [place] [address] [phone]        [facility] Follow up on 04/20/2017.   Specialty:  Behavioral Health Why:  at 3:30pm for your first therapy appt with Theodoro Gristave. Please arrive 20 minutes early to complete new patient paperwork.  Contact information: [address] [place] [address] [phone]           Next level of care provider [facility]:no  Safety Planning and Suicide Prevention discussed: Yes,  with pt and with pt's friend.  Have you used any form of tobacco in the last 30 days? (Cigarettes, Smokeless Tobacco, Cigars, and/or Pipes): No  Has patient been referred to the Quitline?: N/A patient is not a smoker  Patient has been referred for addiction treatment: Yes  Jonathon JordanLynn B Staley Budzinski, MSW, LCSWA 04/18/2017, 1:26 PM

## 2017-04-18 NOTE — Progress Notes (Signed)
Recreation Therapy Notes  Date: 04/18/17 Time: 0930 Location: [address] Group Room  Group Topic: Stress Management  Goal Area(s) Addresses:  Patient will verbalize importance of using healthy stress management.  Patient will identify positive emotions associated with healthy stress management.   Intervention: Stress Management  Activity :  Guided Imagery.  LRT introduced the stress management technique of guided imagery.  LRT read a script to allow patients to engage in the technique.  Patients were to follow along as the script was read to fully participate.  Education: Stress Management, Discharge Planning.   Education Outcome: Acknowledges edcuation/In group clarification offered/Needs additional education  Clinical Observations/Feedback: Pt did not attend group.   Caroll RancherMarjette Francetta Ilg, LRT/CTRS        Caroll RancherLindsay, Asianna Brundage A 04/18/2017 2:33 PM

## 2017-04-18 NOTE — Progress Notes (Signed)
Nursing Progress Note 1900-0730  D) Patient presents with labile mood and affect. Patient reports "I am excited because I haven't thrown up in two days. I feel great". Patient is seen interactive in the milieu, laughing with peers. Patient denies SI/HI/AVH or pain. Patient contracts for safety on the unit. Patient reports sleeping well with current regimen.   A) Emotional support given. 1:1 interaction and active listening provided. Patient medicated as prescribed. Medications and plan of care reviewed with patient. Patient verbalized understanding without further questions. Snacks and fluids provided. Opportunities for questions or concerns presented to patient. Patient encouraged to continue to work on treatment goals. Labs, vital signs and patient behavior monitored throughout shift. Patient safety maintained with q15 min safety checks. Low fall risk precautions in place and reviewed with patient; patient verbalized understanding.  R) Patient receptive to interaction with nurse. Patient remains safe on the unit at this time. Patient denies any adverse medication reactions at this time. Patient is resting in bed without complaints. Will continue to monitor.

## 2017-04-18 NOTE — Plan of Care (Signed)
Problem: Coping: Goal: Ability to demonstrate self-control will improve Outcome: Progressing Patient reports to writer "I haven't thrown up in two days". Patient has been calm and cooperative on the unit.
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
## 2017-04-18 NOTE — Progress Notes (Signed)
Patient attended group and said that her day was a 6. Patient expressed that said she did not have anything poistivie to contribute.
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
## 2017-11-18 ENCOUNTER — Other Ambulatory Visit: Payer: Self-pay

## 2017-11-18 ENCOUNTER — Emergency Department (HOSPITAL_BASED_OUTPATIENT_CLINIC_OR_DEPARTMENT_OTHER)
Admission: EM | Admit: 2017-11-18 | Discharge: 2017-11-18 | Disposition: A | Discharge status: Home / Self Care | Payer: Self-pay | Attending: Emergency Medicine | Admitting: Emergency Medicine

## 2017-11-18 ENCOUNTER — Encounter (HOSPITAL_BASED_OUTPATIENT_CLINIC_OR_DEPARTMENT_OTHER): Payer: Self-pay

## 2017-11-18 DIAGNOSIS — I10 Essential (primary) hypertension: Secondary | ICD-10-CM | POA: Insufficient documentation

## 2017-11-18 DIAGNOSIS — R251 Tremor, unspecified: Secondary | ICD-10-CM | POA: Insufficient documentation

## 2017-11-18 DIAGNOSIS — Z79899 Other long term (current) drug therapy: Secondary | ICD-10-CM | POA: Insufficient documentation

## 2017-11-18 DIAGNOSIS — R0600 Dyspnea, unspecified: Secondary | ICD-10-CM | POA: Insufficient documentation

## 2017-11-18 DIAGNOSIS — F419 Anxiety disorder, unspecified: Secondary | ICD-10-CM

## 2017-11-18 DIAGNOSIS — F12988 Cannabis use, unspecified with other cannabis-induced disorder: Secondary | ICD-10-CM | POA: Insufficient documentation

## 2017-11-18 DIAGNOSIS — F1210 Cannabis abuse, uncomplicated: Secondary | ICD-10-CM | POA: Insufficient documentation

## 2017-11-18 DIAGNOSIS — R112 Nausea with vomiting, unspecified: Secondary | ICD-10-CM

## 2017-11-18 HISTORY — DX: Post-traumatic stress disorder, unspecified: F43.10

## 2017-11-18 HISTORY — DX: Diaphragmatic hernia without obstruction or gangrene: K44.9

## 2017-11-18 LAB — URINALYSIS, ROUTINE W REFLEX MICROSCOPIC
Bilirubin Urine: NEGATIVE
Glucose, UA: NEGATIVE mg/dL
Ketones, ur: NEGATIVE mg/dL
Nitrite: NEGATIVE
PH: 7.5 (ref 5.0–8.0)
PROTEIN: NEGATIVE mg/dL
SPECIFIC GRAVITY, URINE: 1.02 (ref 1.005–1.030)

## 2017-11-18 LAB — PREGNANCY, URINE: PREG TEST UR: NEGATIVE

## 2017-11-18 LAB — RAPID URINE DRUG SCREEN, HOSP PERFORMED
AMPHETAMINES: NOT DETECTED
BENZODIAZEPINES: NOT DETECTED
Barbiturates: NOT DETECTED
Cocaine: NOT DETECTED
Opiates: NOT DETECTED
Tetrahydrocannabinol: POSITIVE — AB

## 2017-11-18 LAB — URINALYSIS, MICROSCOPIC (REFLEX)

## 2017-11-18 MED ORDER — KETOROLAC TROMETHAMINE 30 MG/ML IJ SOLN
15.0000 mg | Freq: Once | INTRAMUSCULAR | Status: AC
Start: 2017-11-18 — End: 2017-11-18
  Administered 2017-11-18: 15 mg via INTRAVENOUS
  Filled 2017-11-18: qty 1

## 2017-11-18 MED ORDER — CAPSAICIN 0.075 % EX CREA
TOPICAL_CREAM | Freq: Two times a day (BID) | CUTANEOUS | Status: DC
Start: 2017-11-18 — End: 2017-11-18
  Administered 2017-11-18: 05:00:00 via TOPICAL
  Filled 2017-11-18: qty 60

## 2017-11-18 MED ORDER — LORAZEPAM 1 MG PO TABS
1.0000 mg | ORAL_TABLET | Freq: Once | ORAL | Status: AC
  Administered 2017-11-18: 1 mg via ORAL
  Filled 2017-11-18: qty 1

## 2017-11-18 MED ORDER — HALOPERIDOL LACTATE 5 MG/ML IJ SOLN
5.0000 mg | Freq: Once | INTRAMUSCULAR | Status: AC
  Administered 2017-11-18: 5 mg via INTRAVENOUS
  Filled 2017-11-18: qty 1

## 2017-11-18 NOTE — ED Triage Notes (Signed)
Pt presents via [facility] with abdominal pain and N[facility] Prescriptions for zantac and zofran not filled. Pt reports getting better, new onset tonight. Pt took vistaril and tums prior to arrival. Per EMS pt has hx of hyperemesis cannabis induced. Last THC use Saturday.

## 2017-11-18 NOTE — ED Notes (Signed)
Pt tolerated fluids at this time, reports feeling better and wanting to go home.
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
## 2017-11-18 NOTE — Discharge Instructions (Addendum)
You were seen in the emergency department for anxiety.  Will be important for you to follow-up with a regular provider so that he can be started on a controller medication. This will help prevent panic attacks in the future. The information for a psychiatrist is included on this sheet.

## 2017-11-18 NOTE — ED Triage Notes (Signed)
Pt c/o panic attack since leaving here this morning, has tried her vistaril without success
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
## 2017-11-18 NOTE — ED Notes (Signed)
Per patient she is anxious and states that her prescription medication is not working.

## 2017-11-18 NOTE — ED Notes (Signed)
Pt given gingerale for PO challenge 

## 2017-11-30 ENCOUNTER — Encounter (HOSPITAL_BASED_OUTPATIENT_CLINIC_OR_DEPARTMENT_OTHER): Payer: Self-pay | Admitting: Emergency Medicine

## 2017-11-30 ENCOUNTER — Encounter (HOSPITAL_BASED_OUTPATIENT_CLINIC_OR_DEPARTMENT_OTHER): Payer: Self-pay

## 2017-11-30 ENCOUNTER — Emergency Department (HOSPITAL_BASED_OUTPATIENT_CLINIC_OR_DEPARTMENT_OTHER)
Admission: EM | Admit: 2017-11-30 | Discharge: 2017-11-30 | Disposition: A | Discharge status: Home / Self Care | Payer: Self-pay | Attending: Emergency Medicine | Admitting: Emergency Medicine

## 2017-11-30 ENCOUNTER — Other Ambulatory Visit: Payer: Self-pay

## 2017-11-30 DIAGNOSIS — R1013 Epigastric pain: Secondary | ICD-10-CM | POA: Insufficient documentation

## 2017-11-30 DIAGNOSIS — F1210 Cannabis abuse, uncomplicated: Secondary | ICD-10-CM | POA: Insufficient documentation

## 2017-11-30 DIAGNOSIS — I10 Essential (primary) hypertension: Secondary | ICD-10-CM | POA: Insufficient documentation

## 2017-11-30 DIAGNOSIS — Z79899 Other long term (current) drug therapy: Secondary | ICD-10-CM | POA: Insufficient documentation

## 2017-11-30 DIAGNOSIS — R0602 Shortness of breath: Secondary | ICD-10-CM | POA: Insufficient documentation

## 2017-11-30 DIAGNOSIS — R110 Nausea: Secondary | ICD-10-CM | POA: Insufficient documentation

## 2017-11-30 DIAGNOSIS — F419 Anxiety disorder, unspecified: Secondary | ICD-10-CM | POA: Insufficient documentation

## 2017-11-30 HISTORY — DX: Cannabis use, unspecified with other cannabis-induced disorder: F12.988

## 2017-11-30 HISTORY — DX: Cannabis use, unspecified, uncomplicated: F12.90

## 2017-11-30 HISTORY — DX: Nausea with vomiting, unspecified: R11.2

## 2017-11-30 HISTORY — DX: Sedative, hypnotic or anxiolytic abuse, uncomplicated: F13.10

## 2017-11-30 LAB — URINALYSIS, MICROSCOPIC (REFLEX)

## 2017-11-30 LAB — CBC WITH DIFFERENTIAL/PLATELET
Basophils Absolute: 0 10*3/uL (ref 0.0–0.1)
Basophils Relative: 0 %
EOS ABS: 0.2 10*3/uL (ref 0.0–0.7)
Eosinophils Relative: 2 %
HCT: 39.9 % (ref 36.0–46.0)
HEMOGLOBIN: 13.5 g/dL (ref 12.0–15.0)
LYMPHS ABS: 2.5 10*3/uL (ref 0.7–4.0)
Lymphocytes Relative: 30 %
MCH: 28.8 pg (ref 26.0–34.0)
MCHC: 33.8 g/dL (ref 30.0–36.0)
MCV: 85.1 fL (ref 78.0–100.0)
MONOS PCT: 6 %
Monocytes Absolute: 0.5 10*3/uL (ref 0.1–1.0)
NEUTROS PCT: 62 %
Neutro Abs: 5.2 10*3/uL (ref 1.7–7.7)
Platelets: 313 10*3/uL (ref 150–400)
RBC: 4.69 MIL/uL (ref 3.87–5.11)
RDW: 12.5 % (ref 11.5–15.5)
WBC: 8.4 10*3/uL (ref 4.0–10.5)

## 2017-11-30 LAB — COMPREHENSIVE METABOLIC PANEL
ALK PHOS: 64 U/L (ref 38–126)
ALT: 29 U/L (ref 14–54)
ANION GAP: 9 (ref 5–15)
AST: 24 U/L (ref 15–41)
Albumin: 4.1 g/dL (ref 3.5–5.0)
BILIRUBIN TOTAL: 0.5 mg/dL (ref 0.3–1.2)
BUN: 10 mg/dL (ref 6–20)
CALCIUM: 9 mg/dL (ref 8.9–10.3)
CO2: 24 mmol/L (ref 22–32)
Chloride: 104 mmol/L (ref 101–111)
Creatinine, Ser: 0.49 mg/dL (ref 0.44–1.00)
Glucose, Bld: 130 mg/dL — ABNORMAL HIGH (ref 65–99)
Potassium: 3.9 mmol/L (ref 3.5–5.1)
SODIUM: 137 mmol/L (ref 135–145)
TOTAL PROTEIN: 7.2 g/dL (ref 6.5–8.1)

## 2017-11-30 LAB — URINALYSIS, ROUTINE W REFLEX MICROSCOPIC
BILIRUBIN URINE: NEGATIVE
Glucose, UA: NEGATIVE mg/dL
Ketones, ur: NEGATIVE mg/dL
NITRITE: NEGATIVE
PROTEIN: 30 mg/dL — AB
Specific Gravity, Urine: 1.025 (ref 1.005–1.030)
pH: 7 (ref 5.0–8.0)

## 2017-11-30 LAB — PREGNANCY, URINE: PREG TEST UR: NEGATIVE

## 2017-11-30 LAB — LIPASE, BLOOD: LIPASE: 32 U/L (ref 11–51)

## 2017-11-30 MED ORDER — HALOPERIDOL LACTATE 5 MG/ML IJ SOLN
2.0000 mg | Freq: Once | INTRAMUSCULAR | Status: AC
  Administered 2017-11-30: 2 mg via INTRAVENOUS
  Filled 2017-11-30: qty 1

## 2017-11-30 MED ORDER — LORAZEPAM 1 MG PO TABS
1.0000 mg | ORAL_TABLET | Freq: Once | ORAL | Status: AC
  Administered 2017-11-30: 1 mg via ORAL
  Filled 2017-11-30: qty 1

## 2017-11-30 MED ORDER — PROMETHAZINE HCL 25 MG PO TABS: 25.0000 mg | ORAL_TABLET | Freq: Four times a day (QID) | ORAL | 0 refills | Status: DC | PRN

## 2017-11-30 MED ORDER — KETOROLAC TROMETHAMINE 30 MG/ML IJ SOLN
30.0000 mg | Freq: Once | INTRAMUSCULAR | Status: AC
  Administered 2017-11-30: 30 mg via INTRAVENOUS
  Filled 2017-11-30: qty 1

## 2017-11-30 NOTE — ED Notes (Signed)
Pt and her boyfriend requested "something else for anxiety before she goes."  Boyfriend sts she is still anxious.  MD notified.  No new orders.

## 2017-11-30 NOTE — ED Triage Notes (Signed)
Pt presents with abdominal and back pain that started last night and nausea

## 2017-11-30 NOTE — ED Notes (Signed)
Pt states that she has not made a follow up appointment yet, but that she will "in the morning when they open up."  Pt reminded again that she needs to see a PCP to get her anxiety medication, she verbalizes understanding of dc instructions and denies any further needs at this time
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
## 2017-11-30 NOTE — Discharge Instructions (Signed)
You need to follow-up with a psychiatrist for further evaluation and treatment of your anxiety.  Please follow-up with [facility] or another resource indicated below.  We cannot provide the treatment that you need for your anxiety and panic attacks in the emergency department. Please return to the emergency department if you develop any new or worsening symptoms.

## 2017-11-30 NOTE — ED Triage Notes (Signed)
Pt c/o "panic attack all day"-NAD-steady gait-calm/cooperative

## 2017-11-30 NOTE — ED Provider Notes (Signed)
Patient's lab work is unremarkable.  She does have many bacteria in her urine but she completely denies any urinary symptoms so my suspicion is this would likely represent asymptomatic bacteriuria.  She has no abdominal pain at this time.  Will discharge with Phenergan for nausea control.  She has had pain on and off for a couple years and will be referred to gastroenterology.  Discussed return precautions.   Pricilla LovelessGoldston, Hiroko Tregre, MD 11/30/17 [phone]
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
## 2018-01-09 ENCOUNTER — Emergency Department (HOSPITAL_BASED_OUTPATIENT_CLINIC_OR_DEPARTMENT_OTHER)
Admission: EM | Admit: 2018-01-09 | Discharge: 2018-01-09 | Disposition: A | Discharge status: Home / Self Care | Payer: Self-pay | Attending: Emergency Medicine | Admitting: Emergency Medicine

## 2018-01-09 ENCOUNTER — Other Ambulatory Visit: Payer: Self-pay

## 2018-01-09 ENCOUNTER — Encounter (HOSPITAL_BASED_OUTPATIENT_CLINIC_OR_DEPARTMENT_OTHER): Payer: Self-pay | Admitting: Emergency Medicine

## 2018-01-09 DIAGNOSIS — R109 Unspecified abdominal pain: Secondary | ICD-10-CM | POA: Insufficient documentation

## 2018-01-09 DIAGNOSIS — I10 Essential (primary) hypertension: Secondary | ICD-10-CM | POA: Insufficient documentation

## 2018-01-09 DIAGNOSIS — Z79899 Other long term (current) drug therapy: Secondary | ICD-10-CM | POA: Insufficient documentation

## 2018-01-09 DIAGNOSIS — F411 Generalized anxiety disorder: Secondary | ICD-10-CM | POA: Insufficient documentation

## 2018-01-09 DIAGNOSIS — R112 Nausea with vomiting, unspecified: Secondary | ICD-10-CM | POA: Insufficient documentation

## 2018-01-09 LAB — URINALYSIS, ROUTINE W REFLEX MICROSCOPIC
Bilirubin Urine: NEGATIVE
GLUCOSE, UA: NEGATIVE mg/dL
KETONES UR: NEGATIVE mg/dL
Nitrite: NEGATIVE
PH: 5.5 (ref 5.0–8.0)
Protein, ur: NEGATIVE mg/dL

## 2018-01-09 LAB — CBC
HEMATOCRIT: 42.6 % (ref 36.0–46.0)
HEMOGLOBIN: 14.7 g/dL (ref 12.0–15.0)
MCH: 28.7 pg (ref 26.0–34.0)
MCHC: 34.5 g/dL (ref 30.0–36.0)
MCV: 83.2 fL (ref 78.0–100.0)
Platelets: 390 10*3/uL (ref 150–400)
RBC: 5.12 MIL/uL — ABNORMAL HIGH (ref 3.87–5.11)
RDW: 12.6 % (ref 11.5–15.5)
WBC: 10.6 10*3/uL — AB (ref 4.0–10.5)

## 2018-01-09 LAB — COMPREHENSIVE METABOLIC PANEL
ALBUMIN: 4.5 g/dL (ref 3.5–5.0)
ALT: 18 U/L (ref 14–54)
AST: 21 U/L (ref 15–41)
Alkaline Phosphatase: 73 U/L (ref 38–126)
Anion gap: 11 (ref 5–15)
BUN: 12 mg/dL (ref 6–20)
CHLORIDE: 104 mmol/L (ref 101–111)
CO2: 24 mmol/L (ref 22–32)
Calcium: 9.7 mg/dL (ref 8.9–10.3)
Creatinine, Ser: 0.58 mg/dL (ref 0.44–1.00)
GFR calc Af Amer: >60 mL/min (ref >60)
GLUCOSE: 139 mg/dL — AB (ref 65–99)
Potassium: 3.7 mmol/L (ref 3.5–5.1)
Sodium: 139 mmol/L (ref 135–145)
Total Bilirubin: 0.6 mg/dL (ref 0.3–1.2)
Total Protein: 8.1 g/dL (ref 6.5–8.1)

## 2018-01-09 LAB — URINALYSIS, MICROSCOPIC (REFLEX)

## 2018-01-09 LAB — PREGNANCY, URINE: Preg Test, Ur: NEGATIVE

## 2018-01-09 MED ORDER — HYDROXYZINE HCL 25 MG PO TABS
50.0000 mg | ORAL_TABLET | Freq: Once | ORAL | Status: AC
  Administered 2018-01-09: 50 mg via ORAL
  Filled 2018-01-09: qty 2

## 2018-01-09 MED ORDER — HALOPERIDOL LACTATE 5 MG/ML IJ SOLN
2.0000 mg | Freq: Once | INTRAMUSCULAR | Status: AC
  Administered 2018-01-09: 2 mg via INTRAMUSCULAR
  Filled 2018-01-09: qty 1

## 2018-01-09 MED ORDER — HYDROXYZINE HCL 25 MG PO TABS: 25.0000 mg | ORAL_TABLET | Freq: Three times a day (TID) | ORAL | 0 refills | Status: AC | PRN

## 2018-01-09 MED ORDER — ONDANSETRON 4 MG PO TBDP
4.0000 mg | ORAL_TABLET | Freq: Once | ORAL | Status: AC
  Administered 2018-01-09: 4 mg via ORAL
  Filled 2018-01-09: qty 1

## 2018-01-09 NOTE — ED Notes (Signed)
Pt states unable to drink anything right now d/t nausea and vomiting, no vomiting noted at this time

## 2018-01-09 NOTE — ED Triage Notes (Signed)
Patient states onset of upper abd pain and right side lower back pain today at 0400. States nausea; denies vomiting or diarrhea.
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
